# Patient Record
Sex: Male | Born: 1975 | Race: Black or African American | Hispanic: No | Marital: Married | Smoking: Never smoker
Health system: Southern US, Community
[De-identification: ages and names within clinical notes are randomized; demographics above are authoritative.]

## PROBLEM LIST (undated history)

## (undated) DIAGNOSIS — O2230 Deep phlebothrombosis in pregnancy, unspecified trimester: Secondary | ICD-10-CM

## (undated) DIAGNOSIS — Z86711 Personal history of pulmonary embolism: Secondary | ICD-10-CM

## (undated) DIAGNOSIS — D573 Sickle-cell trait: Secondary | ICD-10-CM

## (undated) DIAGNOSIS — R760 Raised antibody titer: Secondary | ICD-10-CM

## (undated) HISTORY — PX: NO PAST SURGERIES: SHX2092

---

## 2016-04-09 ENCOUNTER — Other Ambulatory Visit (HOSPITAL_COMMUNITY): Payer: Self-pay | Admitting: Sports Medicine

## 2016-04-09 ENCOUNTER — Other Ambulatory Visit (HOSPITAL_COMMUNITY): Payer: Self-pay | Admitting: Specialist

## 2016-04-09 ENCOUNTER — Ambulatory Visit (HOSPITAL_COMMUNITY)
Admission: RE | Admit: 2016-04-09 | Discharge: 2016-04-09 | Disposition: A | Discharge status: Home / Self Care | Payer: BLUE CROSS/BLUE SHIELD | Source: Ambulatory Visit | Attending: Cardiology | Admitting: Cardiology

## 2016-04-09 DIAGNOSIS — M79661 Pain in right lower leg: Secondary | ICD-10-CM

## 2016-04-09 DIAGNOSIS — M7989 Other specified soft tissue disorders: Secondary | ICD-10-CM | POA: Insufficient documentation

## 2016-04-10 ENCOUNTER — Other Ambulatory Visit: Payer: Self-pay | Admitting: Sports Medicine

## 2016-04-10 ENCOUNTER — Ambulatory Visit
Admission: RE | Admit: 2016-04-10 | Discharge: 2016-04-10 | Disposition: A | Discharge status: Home / Self Care | Payer: BLUE CROSS/BLUE SHIELD | Source: Ambulatory Visit | Attending: Sports Medicine | Admitting: Sports Medicine

## 2016-04-10 DIAGNOSIS — R0602 Shortness of breath: Secondary | ICD-10-CM

## 2016-04-10 MED ORDER — IOPAMIDOL (ISOVUE-370) INJECTION 76%
80.0000 mL | Freq: Once | INTRAVENOUS | Status: AC | PRN
  Administered 2016-04-10: 80 mL via INTRAVENOUS

## 2016-04-16 ENCOUNTER — Other Ambulatory Visit: Payer: Self-pay | Admitting: *Deleted

## 2016-04-16 ENCOUNTER — Telehealth: Payer: Self-pay | Admitting: Oncology

## 2016-04-16 DIAGNOSIS — I2699 Other pulmonary embolism without acute cor pulmonale: Secondary | ICD-10-CM

## 2016-04-16 DIAGNOSIS — I824Z9 Acute embolism and thrombosis of unspecified deep veins of unspecified distal lower extremity: Secondary | ICD-10-CM

## 2016-04-16 NOTE — Telephone Encounter (Signed)
Pt confirmed appt. No letter mailed out,

## 2016-04-17 ENCOUNTER — Telehealth: Payer: Self-pay | Admitting: Oncology

## 2016-04-17 ENCOUNTER — Telehealth: Payer: Self-pay | Admitting: *Deleted

## 2016-04-17 NOTE — Telephone Encounter (Signed)
Called patient to confirm new patient appt for Tuesday 04/24/2016.  Patient to be here at 745am

## 2016-04-17 NOTE — Telephone Encounter (Signed)
Patient called and wanted to cancel his appointment in Feburary. He was able to get in with Dr Myna HidalgoEnnever sooner

## 2016-04-19 ENCOUNTER — Telehealth (HOSPITAL_COMMUNITY): Payer: Self-pay | Admitting: Cardiology

## 2016-04-20 NOTE — Telephone Encounter (Signed)
Close encounter 

## 2016-04-23 DIAGNOSIS — I2699 Other pulmonary embolism without acute cor pulmonale: Secondary | ICD-10-CM | POA: Insufficient documentation

## 2016-04-23 DIAGNOSIS — I82401 Acute embolism and thrombosis of unspecified deep veins of right lower extremity: Secondary | ICD-10-CM | POA: Insufficient documentation

## 2016-04-24 ENCOUNTER — Other Ambulatory Visit (HOSPITAL_BASED_OUTPATIENT_CLINIC_OR_DEPARTMENT_OTHER): Payer: BLUE CROSS/BLUE SHIELD

## 2016-04-24 ENCOUNTER — Ambulatory Visit (HOSPITAL_BASED_OUTPATIENT_CLINIC_OR_DEPARTMENT_OTHER): Payer: BLUE CROSS/BLUE SHIELD | Admitting: Hematology & Oncology

## 2016-04-24 VITALS — BP 106/72 | HR 79 | Temp 97.7°F | Wt 209.2 lb

## 2016-04-24 DIAGNOSIS — I82491 Acute embolism and thrombosis of other specified deep vein of right lower extremity: Secondary | ICD-10-CM

## 2016-04-24 DIAGNOSIS — I2699 Other pulmonary embolism without acute cor pulmonale: Secondary | ICD-10-CM | POA: Diagnosis not present

## 2016-04-24 DIAGNOSIS — D573 Sickle-cell trait: Secondary | ICD-10-CM | POA: Diagnosis not present

## 2016-04-24 DIAGNOSIS — Z7901 Long term (current) use of anticoagulants: Secondary | ICD-10-CM

## 2016-04-24 DIAGNOSIS — I82401 Acute embolism and thrombosis of unspecified deep veins of right lower extremity: Secondary | ICD-10-CM

## 2016-04-24 LAB — CBC WITH DIFFERENTIAL (CANCER CENTER ONLY)
BASO#: 0 10*3/uL (ref 0.0–0.2)
BASO%: 0.3 % (ref 0.0–2.0)
EOS%: 3.9 % (ref 0.0–7.0)
Eosinophils Absolute: 0.1 10*3/uL (ref 0.0–0.5)
HEMATOCRIT: 44.6 % (ref 38.7–49.9)
HGB: 15.6 g/dL (ref 13.0–17.1)
LYMPH#: 1 10*3/uL (ref 0.9–3.3)
LYMPH%: 33.3 % (ref 14.0–48.0)
MCH: 30.5 pg (ref 28.0–33.4)
MCHC: 35 g/dL (ref 32.0–35.9)
MCV: 87 fL (ref 82–98)
MONO#: 0.3 10*3/uL (ref 0.1–0.9)
MONO%: 9.4 % (ref 0.0–13.0)
NEUT#: 1.6 10*3/uL (ref 1.5–6.5)
NEUT%: 53.1 % (ref 40.0–80.0)
PLATELETS: 225 10*3/uL (ref 145–400)
RBC: 5.12 10*6/uL (ref 4.20–5.70)
RDW: 13.1 % (ref 11.1–15.7)
WBC: 3.1 10*3/uL — ABNORMAL LOW (ref 4.0–10.0)

## 2016-04-24 NOTE — Progress Notes (Signed)
Referral MD  Reason for Referral: Bilateral pulmonary emboli and right peroneal vein thrombus   No chief complaint on file. : I had a blood clot in my right leg and lungs.  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 His family is from [place]. He is from [facility]. 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 He's been with them for 6 years. I apologized to him for not realizing that he had been with them for such a long time.  He actually has sickle cell trait. He's never had any problems with this. I told him to take some over-the-counter folic acid for this.  He has been very active. He works out. He plays basketball. He definitely is not sedentary.  He has been traveling quite a bit. He began to have some pain in the right leg several weeks ago. There was no swelling. He actually went to [facility] for a conference. The pain seemed to get a little bit worse.  At work, he noted the pain was progressing. He actually had a Doppler done at work. This showed something that was suspicious. A formal Doppler was done which showed a thrombus in the right peroneal vein.  He knows a cardiologist in the [place]. He recommended a CT angiogram. This was done on January 16. This showed bilateral pulmonary emboli. He did not have a high amount of thrombus. There was no right heart strain. Everything else looked okay.  He was placed on Xarelto. The pain in his right leg got better. He is wearing compression stockings.  He never had a cough. He never had any chest wall pain. He may have had a little bit of pressure under the sternum.  There is no history of blood clots in the family. His mother passed away at age 71 of ovarian cancer.  He does not smoke. He really does not drink alcohol. He has no diabetes. There is no obvious cholesterol issues.  There is no other history in the family of malignancy.  He is pretty much working  full-time. He really enjoys working for his orthopedic group.  Overall, his performance status is ECOG 0.   No past medical history on file.:  No past surgical history on file.:   Current Outpatient Prescriptions:  .  Rivaroxaban (XARELTO) 15 MG TABS tablet, Take 15 mg by mouth 2 (two) times daily with a meal., Disp: , Rfl: :  :  Allergies not on file:  No family history on file.:  Social History   Social History  . Marital status: Married    Spouse name: N/A  . Number of children: N/A  . Years of education: N/A   Occupational History  . Not on file.   Social History Main Topics  . Smoking status: Not on file  . Smokeless tobacco: Not on file  . Alcohol use Not on file  . Drug use: Unknown  . Sexual activity: Not on file   Other Topics Concern  . Not on file   Social History Narrative  . No narrative on file  :  Pertinent items are noted in HPI.  Exam: @IPVITALS @  Well-developed and well-nourished African-American male in no obvious distress. Vital signs show temperature of 97.7. Pulse 79. Blood pressure 106/72. Weight is 209 pounds. Head and neck exam shows no ocular or oral lesions. He has no palpable cervical or supraclavicular lymph nodes. There is no scleral icterus. Thyroid is  nonpalpable. Lungs are clear to percussion and auscultation bilaterally. Cardiac exam regular rate and rhythm with no murmurs, rubs or bruits. Abdomen is soft. He has good bowel sounds. There is no fluid wave. There is no palpable liver or spleen tip. Back exam shows no tenderness over the spine, ribs or hips. Extremities shows no clubbing, cyanosis or edema. He has no venous cord in his legs. He has a negative Homans sign in the right leg. He has good pulses in his distal extremities. He is wearing compression stockings on both legs. He has good range of motion of his joints. Skin exam shows no rashes, ecchymoses or petechia. Neurological exam shows no focal neurological deficits.     Recent Labs  04/24/16 0759  WBC 3.1*  HGB 15.6  HCT 44.6  PLT 225   No results for input(s): NA, K, CL, CO2, GLUCOSE, BUN, CREATININE, CALCIUM in the last 72 hours.  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  He has probably what will be an idiopathic thrombo-embolic event in his right leg and lungs.  I am not sure as to what might be an etiology. He has sickle cell trait. I suppose this could be considered a risk factor. He has never had a problem with sickle cell events. Again I did tell him to try some folic acid over-the-counter.  I did send off a hypercoagulable panel. I did send off a JAK2 assay.  I looked as blood under the microscope. Thyroid did not see anything that looked suspicious. He does not have erythrocytosis. He does not have thrombocytosis.  He has some leukopenia. 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 This is clinically not a problem.  I think that he will need anticoagulation for 1 year. I think that with pulmonary emboli, I tend to be a little bit more aggressive with treating these and treating them longer than lower extremity thrombus.  I will like to repeat his Dopplers of the leg and CT angiogram was see him back in 2 months. We continue everything the same day. This will make it easier for him with his busy work schedule.  He is wearing his compression stockings. I told make sure he keeps wearing these. I told him to make sure he stays well hydrated.  He wants go back to physical activity, I don't see any problem with this. I told him to he's into it.  He obviously is very well informed. I answered all his questions. I want to make sure that we continue to keep him active as he really helps out a lot of folks who have pain problems.  I spent about 45 minutes with Dr.Visconti..Marland Kitchen

## 2016-04-25 LAB — PROTEIN C ACTIVITY: Protein C-Functional: 150 % (ref 73–180)

## 2016-04-25 LAB — D-DIMER, QUANTITATIVE: D-DIMER: 0.43 mg/L FEU (ref 0.00–0.49)

## 2016-04-25 LAB — PROTEIN S ACTIVITY: Protein S-Functional: 131 % (ref 63–140)

## 2016-04-25 LAB — PROTEIN S, TOTAL: PROTEIN S AG TOTAL: 81 % (ref 60–150)

## 2016-04-25 LAB — ANTITHROMBIN III: ANTITHROMBIN ACTIVITY: 111 % (ref 75–135)

## 2016-04-26 LAB — BETA-2-GLYCOPROTEIN I ABS, IGG/M/A
Beta-2 Glyco 1 IgA: <9 GPI IgA units (ref 0–25)
Beta-2 Glyco 1 IgM: <9 GPI IgM units (ref 0–32)

## 2016-04-26 LAB — LUPUS ANTICOAGULANT PANEL
DRVVT CONFIRM: 2.1 ratio — AB (ref 0.8–1.2)
DRVVT: 128.7 s — AB (ref 0.0–47.0)
HEXAGONAL PHASE PHOSPHOLIPID: 16 s — AB (ref 0–11)
PTT-LA Incub Mix: 56.6 s — ABNORMAL HIGH (ref 0.0–48.9)
PTT-LA Mix: 46.4 s (ref 0.0–48.9)
PTT-LA: 53.2 s — ABNORMAL HIGH (ref 0.0–51.9)
dRVVT Mix: 63.4 s — ABNORMAL HIGH (ref 0.0–47.0)

## 2016-04-26 LAB — CARDIOLIPIN ANTIBODIES, IGG, IGM, IGA: Anticardiolipin Ab,IgG,Qn: <9 GPL U/mL (ref 0–14)

## 2016-04-26 LAB — PROTEIN C, TOTAL: Protein C Antigen: 121 % (ref 60–150)

## 2016-04-30 LAB — PROTHROMBIN GENE MUTATION

## 2016-04-30 LAB — FACTOR 5 LEIDEN

## 2016-04-30 NOTE — Addendum Note (Signed)
Addended by: Josph MachoENNEVER, PETER R on: 04/30/2016 06:21 PM   Modules accepted: Orders

## 2016-05-02 ENCOUNTER — Other Ambulatory Visit: Payer: Self-pay

## 2016-05-02 ENCOUNTER — Ambulatory Visit (HOSPITAL_COMMUNITY): Payer: BLUE CROSS/BLUE SHIELD | Attending: Internal Medicine

## 2016-05-02 DIAGNOSIS — I824Z9 Acute embolism and thrombosis of unspecified deep veins of unspecified distal lower extremity: Secondary | ICD-10-CM | POA: Diagnosis not present

## 2016-05-02 DIAGNOSIS — I2699 Other pulmonary embolism without acute cor pulmonale: Secondary | ICD-10-CM | POA: Diagnosis not present

## 2016-05-02 DIAGNOSIS — I071 Rheumatic tricuspid insufficiency: Secondary | ICD-10-CM | POA: Diagnosis not present

## 2016-05-08 ENCOUNTER — Ambulatory Visit: Payer: BLUE CROSS/BLUE SHIELD | Admitting: Oncology

## 2016-07-04 ENCOUNTER — Ambulatory Visit (HOSPITAL_BASED_OUTPATIENT_CLINIC_OR_DEPARTMENT_OTHER): Payer: BLUE CROSS/BLUE SHIELD | Admitting: Hematology & Oncology

## 2016-07-04 ENCOUNTER — Ambulatory Visit: Payer: BLUE CROSS/BLUE SHIELD

## 2016-07-04 ENCOUNTER — Ambulatory Visit (HOSPITAL_BASED_OUTPATIENT_CLINIC_OR_DEPARTMENT_OTHER)
Admission: RE | Admit: 2016-07-04 | Discharge: 2016-07-04 | Disposition: A | Discharge status: Home / Self Care | Payer: BLUE CROSS/BLUE SHIELD | Source: Ambulatory Visit | Attending: Hematology & Oncology | Admitting: Hematology & Oncology

## 2016-07-04 ENCOUNTER — Other Ambulatory Visit (HOSPITAL_BASED_OUTPATIENT_CLINIC_OR_DEPARTMENT_OTHER): Payer: BLUE CROSS/BLUE SHIELD

## 2016-07-04 ENCOUNTER — Encounter (HOSPITAL_BASED_OUTPATIENT_CLINIC_OR_DEPARTMENT_OTHER): Payer: Self-pay

## 2016-07-04 ENCOUNTER — Other Ambulatory Visit: Payer: Self-pay | Admitting: *Deleted

## 2016-07-04 VITALS — BP 106/56 | HR 49 | Temp 97.7°F | Resp 17 | Wt 212.0 lb

## 2016-07-04 DIAGNOSIS — D573 Sickle-cell trait: Secondary | ICD-10-CM | POA: Diagnosis not present

## 2016-07-04 DIAGNOSIS — I2699 Other pulmonary embolism without acute cor pulmonale: Secondary | ICD-10-CM | POA: Diagnosis not present

## 2016-07-04 DIAGNOSIS — I82401 Acute embolism and thrombosis of unspecified deep veins of right lower extremity: Secondary | ICD-10-CM

## 2016-07-04 DIAGNOSIS — R918 Other nonspecific abnormal finding of lung field: Secondary | ICD-10-CM | POA: Diagnosis not present

## 2016-07-04 DIAGNOSIS — Z7901 Long term (current) use of anticoagulants: Secondary | ICD-10-CM | POA: Diagnosis not present

## 2016-07-04 DIAGNOSIS — I82491 Acute embolism and thrombosis of other specified deep vein of right lower extremity: Secondary | ICD-10-CM | POA: Diagnosis not present

## 2016-07-04 DIAGNOSIS — Z86711 Personal history of pulmonary embolism: Secondary | ICD-10-CM | POA: Insufficient documentation

## 2016-07-04 DIAGNOSIS — Z86718 Personal history of other venous thrombosis and embolism: Secondary | ICD-10-CM | POA: Diagnosis not present

## 2016-07-04 LAB — CBC WITH DIFFERENTIAL (CANCER CENTER ONLY)
BASO#: 0 10*3/uL (ref 0.0–0.2)
BASO%: 0.3 % (ref 0.0–2.0)
EOS ABS: 0.1 10*3/uL (ref 0.0–0.5)
EOS%: 3.2 % (ref 0.0–7.0)
HEMATOCRIT: 42.8 % (ref 38.7–49.9)
HEMOGLOBIN: 15.1 g/dL (ref 13.0–17.1)
LYMPH#: 1.4 10*3/uL (ref 0.9–3.3)
LYMPH%: 36.9 % (ref 14.0–48.0)
MCH: 30.6 pg (ref 28.0–33.4)
MCHC: 35.3 g/dL (ref 32.0–35.9)
MCV: 87 fL (ref 82–98)
MONO#: 0.4 10*3/uL (ref 0.1–0.9)
MONO%: 11.6 % (ref 0.0–13.0)
NEUT%: 48 % (ref 40.0–80.0)
NEUTROS ABS: 1.8 10*3/uL (ref 1.5–6.5)
Platelets: 192 10*3/uL (ref 145–400)
RBC: 4.93 10*6/uL (ref 4.20–5.70)
RDW: 13 % (ref 11.1–15.7)
WBC: 3.8 10*3/uL — AB (ref 4.0–10.0)

## 2016-07-04 MED ORDER — IOPAMIDOL (ISOVUE-370) INJECTION 76%
100.0000 mL | Freq: Once | INTRAVENOUS | Status: AC | PRN
Start: 2016-07-04 — End: 2016-07-04
  Administered 2016-07-04: 100 mL via INTRAVENOUS

## 2016-07-04 MED ORDER — XARELTO 20 MG PO TABS: 20.0000 mg | ORAL_TABLET | Freq: Every day | ORAL | 6 refills | Status: DC

## 2016-07-04 NOTE — Progress Notes (Signed)
Hematology and Oncology Follow Up Visit  Eric Gaines [id number] 12-28-1975 41 y.o. 07/04/2016   Principle Diagnosis:   Bilateral pulmonary emboli/right peroneal vein thrombosis  Transiently positive lupus anticoagulant  Sickle cell trait  Current Therapy:    Xarelto 20 mg by mouth daily - complete 6 months of full dose therapy in July 2018  Folic acid 800 g by mouth daily     Interim History:  Eric Gaines is back for follow-up. This is his second office visit. I first saw him in January. At that time, repeat has a him for hypercoagulable states. Of course, he was positive for a lupus anticoagulant. I'm not sure why this laboratory that we use have over a 70% positive lupus anticoagulant result.  He is on Xarelto. He is doing well with Xarelto.  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 He has had no problems with this.  We did go ahead and repeat his CT angiogram today. There is no evidence of residual pulmonary emboli. I'm not sure exactly what the radiologist meant by a "single nonobstructing web" within left lower lobe segmental branch. There is no thromboembolic disease in his legs.  He is exercising. He is traveling. He does wear compression stockings on occasion.  He has had no bleeding. He has had no nausea or vomiting. There's been no change in bowel or bladder habits.  He has not noted any leg swelling.  Overall, his performance status is ECOG 0.  Medications:  Current Outpatient Prescriptions:  .  XARELTO 20 MG TABS tablet, Take 1 tablet (20 mg total) by mouth daily., Disp: 30 tablet, Rfl: 6  Allergies: Not on File  Past Medical History, Surgical history, Social history, and Family History were reviewed and updated.  Review of Systems: As above  Physical Exam:  weight is 212 lb (96.2 kg). His oral temperature is 97.7 F (36.5 C). His blood pressure is 106/56 (abnormal) and his pulse is 49 (abnormal). His respiration is 17  and oxygen saturation is 99%.   Wt Readings from Last 3 Encounters:  07/04/16 212 lb (96.2 kg)  04/24/16 209 lb 4 oz (94.9 kg)      Head and neck exam shows no ocular or oral lesions. He has no palpable cervical or supraclavicular lymph nodes. There is no scleral icterus. Thyroid is nonpalpable. Lungs are clear to percussion and auscultation bilaterally. Cardiac exam regular rate and rhythm with no murmurs, rubs or bruits. Abdomen is soft. He has good bowel sounds. There is no fluid wave. There is no palpable liver or spleen tip. Back exam shows no tenderness over the spine, ribs or hips. Extremities shows no clubbing, cyanosis or edema. He has no venous cord in his legs. He has a negative Homans sign in the right leg. He has good pulses in his distal extremities. He is wearing compression stockings on both legs. He has good range of motion of his joints. Skin exam shows no rashes, ecchymoses or petechia. Neurological exam shows no focal neurological deficits.  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 He is doing well. His thromboembolic disease has resolved which is a blessing.  I will keep him on full dose Xarelto for 6 months. He'll finish up the 6 months in July. After that, I will put him on one year of low-dose maintenance Xarelto.  I'm not sure exactly what the lupus anticoagulant positivity means. Again, most of our patients seems to have this according to the lab that we send our blood work to.  I will repeat the lupus anticoagulant panel. If this is positive, then he will need to be on aspirin.  I will like to see him back in 3 months. At  that point, we will then get him onto low-dose maintenance Xarelto.  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 He agrees with my recommendations.   Josph Macho, MD 4/11/20185:36 PM

## 2016-07-05 LAB — HEMOGLOBINOPATHY EVALUATION
HGB A: 57.1 % — AB (ref 96.4–98.8)
HGB C: 0 %
HGB S: 39.7 % — ABNORMAL HIGH
HGB VARIANT: 0 %
Hemoglobin A2 Quantitation: 3.2 % (ref 1.8–3.2)
Hemoglobin F Quantitation: 0 % (ref 0.0–2.0)

## 2016-07-05 LAB — D-DIMER, QUANTITATIVE (NOT AT ARMC): D-DIMER: 0.2 mg{FEU}/L (ref 0.00–0.49)

## 2016-07-06 LAB — LUPUS ANTICOAGULANT PANEL
DRVVT CONFIRM: 2 ratio — AB (ref 0.8–1.2)
PTT-LA: 50.5 s (ref 0.0–51.9)
dRVVT Mix: 77 s — ABNORMAL HIGH (ref 0.0–47.0)
dRVVT: 123.8 s — ABNORMAL HIGH (ref 0.0–47.0)

## 2016-07-18 ENCOUNTER — Other Ambulatory Visit: Payer: BLUE CROSS/BLUE SHIELD

## 2016-07-18 ENCOUNTER — Other Ambulatory Visit (HOSPITAL_BASED_OUTPATIENT_CLINIC_OR_DEPARTMENT_OTHER): Payer: BLUE CROSS/BLUE SHIELD

## 2016-07-18 ENCOUNTER — Ambulatory Visit: Payer: BLUE CROSS/BLUE SHIELD | Admitting: Hematology & Oncology

## 2016-07-19 ENCOUNTER — Other Ambulatory Visit (HOSPITAL_BASED_OUTPATIENT_CLINIC_OR_DEPARTMENT_OTHER): Payer: BLUE CROSS/BLUE SHIELD

## 2016-07-19 ENCOUNTER — Other Ambulatory Visit: Payer: BLUE CROSS/BLUE SHIELD

## 2016-07-19 ENCOUNTER — Ambulatory Visit: Payer: BLUE CROSS/BLUE SHIELD | Admitting: Hematology & Oncology

## 2016-10-03 ENCOUNTER — Other Ambulatory Visit: Payer: BLUE CROSS/BLUE SHIELD

## 2016-10-03 ENCOUNTER — Ambulatory Visit: Payer: BLUE CROSS/BLUE SHIELD | Admitting: Hematology & Oncology

## 2016-10-17 ENCOUNTER — Other Ambulatory Visit (HOSPITAL_BASED_OUTPATIENT_CLINIC_OR_DEPARTMENT_OTHER): Payer: BLUE CROSS/BLUE SHIELD

## 2016-10-17 ENCOUNTER — Ambulatory Visit (HOSPITAL_BASED_OUTPATIENT_CLINIC_OR_DEPARTMENT_OTHER): Payer: BLUE CROSS/BLUE SHIELD | Admitting: Hematology & Oncology

## 2016-10-17 VITALS — BP 116/63 | HR 50 | Temp 98.2°F | Resp 17 | Wt 215.0 lb

## 2016-10-17 DIAGNOSIS — I2699 Other pulmonary embolism without acute cor pulmonale: Secondary | ICD-10-CM

## 2016-10-17 DIAGNOSIS — D573 Sickle-cell trait: Secondary | ICD-10-CM | POA: Diagnosis not present

## 2016-10-17 DIAGNOSIS — Z7901 Long term (current) use of anticoagulants: Secondary | ICD-10-CM

## 2016-10-17 DIAGNOSIS — I82491 Acute embolism and thrombosis of other specified deep vein of right lower extremity: Secondary | ICD-10-CM

## 2016-10-17 LAB — CBC WITH DIFFERENTIAL (CANCER CENTER ONLY)
BASO#: 0 10*3/uL (ref 0.0–0.2)
BASO%: 0.3 % (ref 0.0–2.0)
EOS%: 4.3 % (ref 0.0–7.0)
Eosinophils Absolute: 0.2 10*3/uL (ref 0.0–0.5)
HEMATOCRIT: 44.1 % (ref 38.7–49.9)
HEMOGLOBIN: 15.4 g/dL (ref 13.0–17.1)
LYMPH#: 1.3 10*3/uL (ref 0.9–3.3)
LYMPH%: 37.9 % (ref 14.0–48.0)
MCH: 30.7 pg (ref 28.0–33.4)
MCHC: 34.9 g/dL (ref 32.0–35.9)
MCV: 88 fL (ref 82–98)
MONO#: 0.4 10*3/uL (ref 0.1–0.9)
MONO%: 10.5 % (ref 0.0–13.0)
NEUT%: 47 % (ref 40.0–80.0)
NEUTROS ABS: 1.7 10*3/uL (ref 1.5–6.5)
Platelets: 201 10*3/uL (ref 145–400)
RBC: 5.02 10*6/uL (ref 4.20–5.70)
RDW: 13.4 % (ref 11.1–15.7)
WBC: 3.5 10*3/uL — AB (ref 4.0–10.0)

## 2016-10-17 LAB — CMP (CANCER CENTER ONLY)
ALBUMIN: 3.9 g/dL (ref 3.3–5.5)
ALK PHOS: 67 U/L (ref 26–84)
ALT(SGPT): 31 U/L (ref 10–47)
AST: 26 U/L (ref 11–38)
BILIRUBIN TOTAL: 1 mg/dL (ref 0.20–1.60)
BUN, Bld: 12 mg/dL (ref 7–22)
CALCIUM: 9.1 mg/dL (ref 8.0–10.3)
CO2: 29 mEq/L (ref 18–33)
CREATININE: 1 mg/dL (ref 0.6–1.2)
Chloride: 104 mEq/L (ref 98–108)
Glucose, Bld: 84 mg/dL (ref 73–118)
Potassium: 3.8 mEq/L (ref 3.3–4.7)
SODIUM: 139 meq/L (ref 128–145)
Total Protein: 7.2 g/dL (ref 6.4–8.1)

## 2016-10-17 MED ORDER — RIVAROXABAN 10 MG PO TABS: 10.0000 mg | ORAL_TABLET | Freq: Every day | ORAL | 12 refills | Status: DC

## 2016-10-17 NOTE — Progress Notes (Signed)
Hematology and Oncology Follow Up Visit  Eric Gaines [id number] 1975/08/02 41 y.o. 10/17/2016   Principle Diagnosis:   Bilateral pulmonary emboli/right peroneal vein thrombosis  Transiently positive lupus anticoagulant  Sickle cell trait  Current Therapy:    Xarelto 20 mg by mouth daily - complete 6 months of full dose therapy in July 2018  Xarelto 10 mg by mouth daily-maintenance therapy for one year to be completed in July 2019  Folic acid 800 g by mouth daily     Interim History:  Mr. Eric Gaines is back for follow-up. He is doing quite well. 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 He does a lot of spinal work.  He is doing well with Xarelto. He will finish one year this month and then go to maintenance therapy with 10 mg.  Of note, he did have a positive lupus anticoagulant study we first saw him. I am repeating this today. If he is positive for the lupus anticoagulant, we will see about aspirin added to the Xarelto.  He's had no leg pain. There's been no swelling. He's had no cough or shortness of breath. He's had no chest wall pain.  He is working out.  He does have sickle cell trait. He is on folic acid for this.  He's had no fever. He's had no rashes. He's had no change in bowel or bladder habits.  Overall, his performance status is ECOG 0.  Medications:  Current Outpatient Prescriptions:  .  XARELTO 20 MG TABS tablet, Take 1 tablet (20 mg total) by mouth daily., Disp: 30 tablet, Rfl: 6  Allergies: Not on File  Past Medical History, Surgical history, Social history, and Family History were reviewed and updated.  Review of Systems: As above  Physical Exam:  vitals were not taken for this visit.  Wt Readings from Last 3 Encounters:  07/04/16 212 lb (96.2 kg)  04/24/16 209 lb 4 oz (94.9 kg)      Head and neck exam shows no ocular or oral lesions. He has no palpable cervical or supraclavicular lymph nodes. There  is no scleral icterus. Thyroid is nonpalpable. Lungs are clear to percussion and auscultation bilaterally. Cardiac exam regular rate and rhythm with no murmurs, rubs or bruits. Abdomen is soft. He has good bowel sounds. There is no fluid wave. There is no palpable liver or spleen tip. Back exam shows no tenderness over the spine, ribs or hips. Extremities shows no clubbing, cyanosis or edema. He has no venous cord in his legs. He has a negative Homans sign in the right leg. He has good pulses in his distal extremities. He is wearing compression stockings on both legs. He has good range of motion of his joints. Skin exam shows no rashes, ecchymoses or petechia. Neurological exam shows no focal neurological deficits.  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 He is doing well. His thromboembolic disease has resolved which is a blessing.  I will now put him on maintenance Xarelto. I will keep him on maintenance Xarelto for  one year.  We will see what his lupus anticoagulant study is. If this is positive, I will add 81 mg of baby aspirin.   I do not see a problem with him going down to [place]. He is going for a medical mission. His wife actually has family in [place], so they will be spending an extra week.  I will like to see him back in 4 months. I'll see that we have to do any scans.   Josph MachoENNEVER,Mattie Novosel R, MD 7/25/20183:42 PM

## 2016-10-18 LAB — D-DIMER, QUANTITATIVE: D-DIMER: <0.2 mg/L FEU (ref 0.00–0.49)

## 2016-10-20 LAB — LUPUS ANTICOAGULANT PANEL
DRVVT CONFIRM: 1.4 ratio — AB (ref 0.8–1.2)
PTT-LA: 45.6 s (ref 0.0–51.9)
dRVVT Mix: 51.4 s — ABNORMAL HIGH (ref 0.0–47.0)
dRVVT: 71.9 s — ABNORMAL HIGH (ref 0.0–47.0)

## 2016-10-23 ENCOUNTER — Telehealth: Payer: Self-pay

## 2016-10-23 NOTE — Telephone Encounter (Signed)
-----   Message from Josph MachoPeter R Ennever, MD sent at 10/22/2016  4:35 PM EDT ----- I left a message on his cell phone. I told him that the lupus anticoagulant is still positive. Because of that, we have to get him on aspirin. I told him to take a baby aspirin with 81 mg daily. I told him to make sure it's coated aspirin and that he takes it with food. He is to take this in addition to the Xarelto. If he has any questions he can always give us a call.  Cindee LamePete

## 2017-02-27 ENCOUNTER — Other Ambulatory Visit: Payer: BLUE CROSS/BLUE SHIELD

## 2017-02-27 ENCOUNTER — Ambulatory Visit: Payer: BLUE CROSS/BLUE SHIELD | Admitting: Hematology & Oncology

## 2017-03-28 ENCOUNTER — Other Ambulatory Visit: Payer: BLUE CROSS/BLUE SHIELD

## 2017-03-28 ENCOUNTER — Ambulatory Visit (HOSPITAL_BASED_OUTPATIENT_CLINIC_OR_DEPARTMENT_OTHER): Payer: BLUE CROSS/BLUE SHIELD | Admitting: Hematology & Oncology

## 2017-03-28 ENCOUNTER — Other Ambulatory Visit: Payer: Self-pay

## 2017-03-28 VITALS — BP 107/59 | HR 57 | Temp 97.7°F | Resp 20 | Wt 213.5 lb

## 2017-03-28 DIAGNOSIS — I82419 Acute embolism and thrombosis of unspecified femoral vein: Secondary | ICD-10-CM | POA: Diagnosis not present

## 2017-03-28 DIAGNOSIS — I2699 Other pulmonary embolism without acute cor pulmonale: Secondary | ICD-10-CM

## 2017-03-28 DIAGNOSIS — D573 Sickle-cell trait: Secondary | ICD-10-CM

## 2017-03-28 DIAGNOSIS — Z7901 Long term (current) use of anticoagulants: Secondary | ICD-10-CM | POA: Diagnosis not present

## 2017-03-28 LAB — CMP (CANCER CENTER ONLY)
ALBUMIN: 3.9 g/dL (ref 3.3–5.5)
ALK PHOS: 74 U/L (ref 26–84)
ALT: 39 U/L (ref 10–47)
AST: 26 U/L (ref 11–38)
BILIRUBIN TOTAL: 0.9 mg/dL (ref 0.20–1.60)
BUN: 13 mg/dL (ref 7–22)
CHLORIDE: 103 meq/L (ref 98–108)
CO2: 31 mEq/L (ref 18–33)
CREATININE: 1.1 mg/dL (ref 0.6–1.2)
Calcium: 9.4 mg/dL (ref 8.0–10.3)
Glucose, Bld: 97 mg/dL (ref 73–118)
Potassium: 4.2 mEq/L (ref 3.3–4.7)
SODIUM: 146 meq/L — AB (ref 128–145)
TOTAL PROTEIN: 7.2 g/dL (ref 6.4–8.1)

## 2017-03-28 LAB — CBC WITH DIFFERENTIAL (CANCER CENTER ONLY)
BASO#: 0 10*3/uL (ref 0.0–0.2)
BASO%: 0.3 % (ref 0.0–2.0)
EOS%: 3.3 % (ref 0.0–7.0)
Eosinophils Absolute: 0.1 10*3/uL (ref 0.0–0.5)
HCT: 45.2 % (ref 38.7–49.9)
HEMOGLOBIN: 16 g/dL (ref 13.0–17.1)
LYMPH#: 1.1 10*3/uL (ref 0.9–3.3)
LYMPH%: 34.3 % (ref 14.0–48.0)
MCH: 30.6 pg (ref 28.0–33.4)
MCHC: 35.4 g/dL (ref 32.0–35.9)
MCV: 86 fL (ref 82–98)
MONO#: 0.3 10*3/uL (ref 0.1–0.9)
MONO%: 8.5 % (ref 0.0–13.0)
NEUT%: 53.6 % (ref 40.0–80.0)
NEUTROS ABS: 1.8 10*3/uL (ref 1.5–6.5)
PLATELETS: 187 10*3/uL (ref 145–400)
RBC: 5.23 10*6/uL (ref 4.20–5.70)
RDW: 13.4 % (ref 11.1–15.7)
WBC: 3.3 10*3/uL — AB (ref 4.0–10.0)

## 2017-03-28 NOTE — Progress Notes (Signed)
Hematology and Oncology Follow Up Visit  Eric Gaines [id number] 1975-06-30 42 y.o. 03/28/2017   Principle Diagnosis:   Bilateral pulmonary emboli/right peroneal vein thrombosis  Positive lupus anticoagulant  Sickle cell trait  Current Therapy:    Xarelto 20 mg by mouth daily - complete 6 months of full dose therapy in July 2018  Xarelto 10 mg by mouth daily-maintenance therapy for one year to be completed in July 2019  Folic acid 800 g by mouth daily  EC ASA 81 mg po q day     Interim History:  Eric Gaines is back for follow-up.  He is doing quite well.  He had a very busy summer.  He and his wife went on a mission trip to [place].  He really was busy on the mission trip.  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  He has had no problems with the Xarelto.  He is on 10 mg a day.  He is working out.  He is playing basketball.  He is exercising.  He is having no issues with bleeding.  There is no cough or shortness of breath.  He has no chest wall pain.  He has had no leg swelling.  There is no change in bowel or bladder habits.  When we last checked his lupus anticoagulant level in July, he was still positive for the lupus anticoagulant.  I told him to take a baby aspirin daily.  Overall, his performance status is ECOG 0.  Medications:  Current Outpatient Medications:  .  aspirin 81 MG chewable tablet, Chew by mouth daily., Disp: , Rfl:  .  folic acid (FOLVITE) 1 MG tablet, Take 1 mg by mouth daily., Disp: , Rfl:  .  rivaroxaban (XARELTO) 10 MG TABS tablet, Take 1 tablet (10 mg total) by mouth daily., Disp: 30 tablet, Rfl: 12  Allergies: No Known Allergies  Past Medical History, Surgical history, Social history, and Family History were reviewed and updated.  Review of Systems: Review of Systems  All other systems reviewed and are negative.    Physical Exam:  weight is 213 lb 8 oz (96.8  kg). His oral temperature is 97.7 F (36.5 C). His blood pressure is 107/59 (abnormal) and his pulse is 57 (abnormal). His respiration is 20 and oxygen saturation is 100%.   Wt Readings from Last 3 Encounters:  03/28/17 213 lb 8 oz (96.8 kg)  10/17/16 215 lb (97.5 kg)  07/04/16 212 lb (96.2 kg)    Physical Exam  Constitutional: He is oriented to person, place, and time.  HENT:  Head: Normocephalic and atraumatic.  Mouth/Throat: Oropharynx is clear and moist.  Eyes: EOM are normal. Pupils are equal, round, and reactive to light.  Neck: Normal range of motion.  Cardiovascular: Normal rate, regular rhythm and normal heart sounds.  Pulmonary/Chest: Effort normal and breath sounds normal.  Abdominal: Soft. Bowel sounds are normal.  Musculoskeletal: Normal range of motion. He exhibits no edema, tenderness or deformity.  Lymphadenopathy:    He has no cervical adenopathy.  Neurological: He is alert and oriented to person, place, and time.  Skin: Skin is warm and dry. No rash noted. No erythema.  Psychiatric: He has a normal mood and affect. His behavior is normal. Judgment and thought content normal.  Vitals reviewed.   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 He is doing well. His thromboembolic disease has resolved which is a blessing.  We will keep him on the maintenance Xarelto for right now.   He is also on the baby aspirin.  He does have a lupus anticoagulant.  He has been tested for this twice.  As such, I think that we will have to have him on some form of blood thinner for the foreseeable future.  I will plan to see him back in 6 months.  At that time, he will have had one year of the maintenance Xarelto.  I may consider a full dose aspirin at that time given the lupus anticoagulant.  He would be okay with this.  Josph MachoPeter R Brogen Duell, MD 1/3/20198:37 AM

## 2017-03-30 LAB — LUPUS ANTICOAGULANT PANEL
DRVVT CONFIRM: 2 ratio — AB (ref 0.8–1.2)
DRVVT MIX: 58.5 s — AB (ref 0.0–47.0)
PTT-LA: 48.7 s (ref 0.0–51.9)
dRVVT: 91.6 s — ABNORMAL HIGH (ref 0.0–47.0)

## 2017-09-25 ENCOUNTER — Inpatient Hospital Stay: Payer: BLUE CROSS/BLUE SHIELD | Admitting: Family

## 2017-09-25 ENCOUNTER — Inpatient Hospital Stay: Payer: BLUE CROSS/BLUE SHIELD

## 2017-10-10 ENCOUNTER — Inpatient Hospital Stay (HOSPITAL_BASED_OUTPATIENT_CLINIC_OR_DEPARTMENT_OTHER): Payer: BLUE CROSS/BLUE SHIELD | Admitting: Hematology & Oncology

## 2017-10-10 ENCOUNTER — Telehealth: Payer: Self-pay | Admitting: *Deleted

## 2017-10-10 ENCOUNTER — Inpatient Hospital Stay: Payer: BLUE CROSS/BLUE SHIELD | Attending: Hematology & Oncology

## 2017-10-10 ENCOUNTER — Other Ambulatory Visit: Payer: Self-pay

## 2017-10-10 VITALS — BP 113/61 | HR 60 | Temp 97.8°F | Resp 20 | Wt 217.2 lb

## 2017-10-10 DIAGNOSIS — I2699 Other pulmonary embolism without acute cor pulmonale: Secondary | ICD-10-CM | POA: Diagnosis not present

## 2017-10-10 DIAGNOSIS — I82491 Acute embolism and thrombosis of other specified deep vein of right lower extremity: Secondary | ICD-10-CM | POA: Diagnosis not present

## 2017-10-10 DIAGNOSIS — Z7982 Long term (current) use of aspirin: Secondary | ICD-10-CM

## 2017-10-10 DIAGNOSIS — D573 Sickle-cell trait: Secondary | ICD-10-CM | POA: Insufficient documentation

## 2017-10-10 LAB — CMP (CANCER CENTER ONLY)
ALT: 25 U/L (ref 0–44)
AST: 22 U/L (ref 15–41)
Albumin: 4.3 g/dL (ref 3.5–5.0)
Alkaline Phosphatase: 70 U/L (ref 38–126)
Anion gap: 7 (ref 5–15)
BUN: 16 mg/dL (ref 6–20)
CHLORIDE: 106 mmol/L (ref 98–111)
CO2: 27 mmol/L (ref 22–32)
CREATININE: 1.22 mg/dL (ref 0.61–1.24)
Calcium: 9.4 mg/dL (ref 8.9–10.3)
GFR, Estimated: >60 mL/min (ref >60)
Glucose, Bld: 91 mg/dL (ref 70–99)
Potassium: 4.2 mmol/L (ref 3.5–5.1)
SODIUM: 140 mmol/L (ref 135–145)
Total Bilirubin: 0.8 mg/dL (ref 0.3–1.2)
Total Protein: 7.1 g/dL (ref 6.5–8.1)

## 2017-10-10 LAB — CBC WITH DIFFERENTIAL (CANCER CENTER ONLY)
BASOS ABS: 0 10*3/uL (ref 0.0–0.1)
Basophils Relative: 0 %
EOS ABS: 0.2 10*3/uL (ref 0.0–0.5)
Eosinophils Relative: 5 %
HCT: 42.7 % (ref 38.7–49.9)
Hemoglobin: 14.9 g/dL (ref 13.0–17.1)
Lymphocytes Relative: 36 %
Lymphs Abs: 1.2 10*3/uL (ref 0.9–3.3)
MCH: 30.5 pg (ref 28.0–33.4)
MCHC: 34.9 g/dL (ref 32.0–35.9)
MCV: 87.5 fL (ref 82.0–98.0)
Monocytes Absolute: 0.4 10*3/uL (ref 0.1–0.9)
Monocytes Relative: 13 %
Neutro Abs: 1.5 10*3/uL (ref 1.5–6.5)
Neutrophils Relative %: 46 %
Platelet Count: 195 10*3/uL (ref 145–400)
RBC: 4.88 MIL/uL (ref 4.20–5.70)
RDW: 13.1 % (ref 11.1–15.7)
WBC Count: 3.2 10*3/uL — ABNORMAL LOW (ref 4.0–10.0)

## 2017-10-10 NOTE — Progress Notes (Signed)
Hematology and Oncology Follow Up Visit  Eric BellingWesley Gaines [id number] [birth year] 42 y.o. 10/10/2017   Principle Diagnosis:   Bilateral pulmonary emboli/right peroneal vein thrombosis  Positive lupus anticoagulant  Sickle cell trait  Current Therapy:    Xarelto 20 mg by mouth daily - complete 6 months of full dose therapy in July 2018  Xarelto 10 mg by mouth daily-maintenance therapy for one year - completed in July 2019  Folic acid 800 g by mouth daily  EC ASA 162 mg po q day -- start on 10/10/2017     Interim History:  Eric Gaines is back for follow-up.  He is doing well.  He has now been on maintenance Xarelto for 1 year.  We will stop the maintenance Xarelto and have him on aspirin at 162 mg a day.  He does have the lupus anticoagulant.  We checked this every time that we can see him.    When we last saw him in January, he had a lupus anticoagulant.  He and his wife went to [place] for vacation.  They had a good time.  He had no problems with respect to cough.  There is no chest wall pain.  He had no leg swelling.  Eric Gaines does wear his compression stocking as needed.  He wears this when he works out.  He wears it when he travels.  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  He does the injections.  He has had no fever.  He is had no rashes.  He has had no dyspepsia.   Overall, his performance status is ECOG 0.  Medications:  Current Outpatient Medications:  .  aspirin 81 MG chewable tablet, Chew by mouth daily., Disp: , Rfl:  .  folic acid (FOLVITE) 1 MG tablet, Take 1 mg by mouth daily., Disp: , Rfl:  .  rivaroxaban (XARELTO) 10 MG TABS tablet, Take 1 tablet (10 mg total) by mouth daily., Disp: 30 tablet, Rfl: 12  Allergies: No Known Allergies  Past Medical History, Surgical history, Social history, and Family History were reviewed and updated.  Review of Systems: Review of Systems  All other systems reviewed and are  negative.    Physical Exam:  vitals were not taken for this visit.   Wt Readings from Last 3 Encounters:  03/28/17 213 lb 8 oz (96.8 kg)  10/17/16 215 lb (97.5 kg)  07/04/16 212 lb (96.2 kg)    Physical Exam  Constitutional: He is oriented to person, place, and time.  HENT:  Head: Normocephalic and atraumatic.  Mouth/Throat: Oropharynx is clear and moist.  Eyes: Pupils are equal, round, and reactive to light. EOM are normal.  Neck: Normal range of motion.  Cardiovascular: Normal rate, regular rhythm and normal heart sounds.  Pulmonary/Chest: Effort normal and breath sounds normal.  Abdominal: Soft. Bowel sounds are normal.  Musculoskeletal: Normal range of motion. He exhibits no edema, tenderness or deformity.  Lymphadenopathy:    He has no cervical adenopathy.  Neurological: He is alert and oriented to person, place, and time.  Skin: Skin is warm and dry. No rash noted. No erythema.  Psychiatric: He has a normal mood and affect. His behavior is normal. Judgment and thought content normal.  Vitals reviewed.   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 He is doing well. His thromboembolic disease has resolved which is a blessing.  Again, we will have him on baby aspirin.  He will take the 162 mg dose.  I will see him back in 6 months.  We will check his lupus anticoagulant level.   If all looks good, then we probably will let him go from our clinic.   Josph Macho, MD 7/18/20198:07 AM

## 2017-10-10 NOTE — Telephone Encounter (Signed)
Message left on patient's private cell to notify him per order of Dr. Myna HidalgoEnnever that chemistry studies are normal.  Instructed pt to call office back with any questions or concerns.

## 2017-10-13 LAB — DRVVT MIX: DRVVT MIX: 54 s — AB (ref 0.0–47.0)

## 2017-10-13 LAB — LUPUS ANTICOAGULANT PANEL
DRVVT: 74 s — ABNORMAL HIGH (ref 0.0–47.0)
PTT Lupus Anticoagulant: 50.1 s (ref 0.0–51.9)

## 2017-10-13 LAB — DRVVT CONFIRM: dRVVT Confirm: 1.4 ratio — ABNORMAL HIGH (ref 0.8–1.2)

## 2018-03-07 ENCOUNTER — Other Ambulatory Visit: Payer: Self-pay | Admitting: Hematology & Oncology

## 2018-03-07 DIAGNOSIS — I2699 Other pulmonary embolism without acute cor pulmonale: Secondary | ICD-10-CM

## 2018-03-07 MED ORDER — RIVAROXABAN 10 MG PO TABS: 10.0000 mg | ORAL_TABLET | Freq: Every day | ORAL | 3 refills | Status: AC

## 2018-04-10 ENCOUNTER — Other Ambulatory Visit: Payer: BLUE CROSS/BLUE SHIELD

## 2018-04-10 ENCOUNTER — Ambulatory Visit: Payer: BLUE CROSS/BLUE SHIELD | Admitting: Hematology & Oncology

## 2018-04-24 ENCOUNTER — Other Ambulatory Visit: Payer: Self-pay

## 2018-04-24 ENCOUNTER — Inpatient Hospital Stay
Payer: No Typology Code available for payment source | Attending: Hematology & Oncology | Admitting: Hematology & Oncology

## 2018-04-24 VITALS — BP 107/53 | HR 55 | Temp 97.7°F | Resp 20 | Wt 222.0 lb

## 2018-04-24 DIAGNOSIS — I2699 Other pulmonary embolism without acute cor pulmonale: Secondary | ICD-10-CM | POA: Diagnosis not present

## 2018-04-24 DIAGNOSIS — D573 Sickle-cell trait: Secondary | ICD-10-CM | POA: Diagnosis not present

## 2018-04-24 DIAGNOSIS — Z7982 Long term (current) use of aspirin: Secondary | ICD-10-CM | POA: Insufficient documentation

## 2018-04-24 DIAGNOSIS — Z7901 Long term (current) use of anticoagulants: Secondary | ICD-10-CM | POA: Insufficient documentation

## 2018-04-24 DIAGNOSIS — I82451 Acute embolism and thrombosis of right peroneal vein: Secondary | ICD-10-CM | POA: Diagnosis not present

## 2018-04-24 DIAGNOSIS — D6862 Lupus anticoagulant syndrome: Secondary | ICD-10-CM | POA: Diagnosis not present

## 2018-04-24 NOTE — Progress Notes (Signed)
Hematology and Oncology Follow Up Visit  Eric Gaines [id number] Mar 14, 1976 43 y.o. 04/24/2018   Principle Diagnosis:   Bilateral pulmonary emboli/right peroneal vein thrombosis  Positive lupus anticoagulant -- (-) on zero 116/2020  Sickle cell trait  RIGHT plantar vein thrombosis  Current Therapy:    Xarelto 20 mg by mouth daily - complete 6 months of full dose therapy in July 2018  Xarelto 10 mg by mouth daily-maintenance therapy for one year - completed in July 2019  Folic acid 800 g by mouth daily  EC ASA 325 mg po q day -- start on 10/10/2017     Interim History:  Eric Gaines is back for follow-up.  Surprisingly, he developed a thrombus in his right plantar vein.  I must say that I have never heard of this in the 25 years I have been working.  It does happen.  He is on full dose aspirin right now.  He had lab work done at a local lab because of the cost.  Thankfully, the local lab did not show any obvious lupus anticoagulant.  He says that he takes Xarelto when he goes on trips.  I think this is a good idea for him.  There is been no problems with bleeding.  He has had no dyspepsia.  He has had no cough or shortness of breath.  He says that there is no pain in his right foot or leg.  Overall, his performance status is ECOG 0.     Medications:  Current Outpatient Medications:  .  aspirin 325 MG EC tablet, Take 325 mg by mouth daily., Disp: , Rfl:  .  folic acid (FOLVITE) 1 MG tablet, Take 1 mg by mouth daily., Disp: , Rfl:  .  rivaroxaban (XARELTO) 10 MG TABS tablet, Take 1 tablet (10 mg total) by mouth daily. (Patient taking differently: Take 10 mg by mouth daily. Taking PRN), Disp: 90 tablet, Rfl: 3 .  oxyCODONE-acetaminophen (PERCOCET) 10-325 MG tablet, TK 1 T PO Q 8 H PRN P, Disp: , Rfl: 0  Allergies: No Known Allergies  Past Medical History, Surgical history, Social history, and Family History were reviewed and updated.  Review of Systems: Review of  Systems  All other systems reviewed and are negative.    Physical Exam:  weight is 222 lb (100.7 kg). His oral temperature is 97.7 F (36.5 C). His blood pressure is 107/53 (abnormal) and his pulse is 55 (abnormal). His respiration is 20 and oxygen saturation is 100%.   Wt Readings from Last 3 Encounters:  04/24/18 222 lb (100.7 kg)  10/10/17 217 lb 4 oz (98.5 kg)  03/28/17 213 lb 8 oz (96.8 kg)    Physical Exam Vitals signs reviewed.  HENT:     Head: Normocephalic and atraumatic.  Eyes:     Pupils: Pupils are equal, round, and reactive to light.  Neck:     Musculoskeletal: Normal range of motion.  Cardiovascular:     Rate and Rhythm: Normal rate and regular rhythm.     Heart sounds: Normal heart sounds.  Pulmonary:     Effort: Pulmonary effort is normal.     Breath sounds: Normal breath sounds.  Abdominal:     General: Bowel sounds are normal.     Palpations: Abdomen is soft.  Musculoskeletal: Normal range of motion.        General: No tenderness or deformity.  Lymphadenopathy:     Cervical: No cervical adenopathy.  Skin:    General: Skin is  warm and dry.     Findings: No erythema or rash.  Neurological:     Mental Status: He is alert and oriented to person, place, and time.  Psychiatric:        Behavior: Behavior normal.        Thought Content: Thought content normal.        Judgment: Judgment normal.     Lab Results  Component Value Date   WBC 3.2 (L) 10/10/2017   HGB 14.9 10/10/2017   HCT 42.7 10/10/2017   MCV 87.5 10/10/2017   PLT 195 10/10/2017     Chemistry      Component Value Date/Time   NA 140 10/10/2017 0747   NA 146 (H) 03/28/2017 0813   K 4.2 10/10/2017 0747   K 4.2 03/28/2017 0813   CL 106 10/10/2017 0747   CL 103 03/28/2017 0813   CO2 27 10/10/2017 0747   CO2 31 03/28/2017 0813   BUN 16 10/10/2017 0747   BUN 13 03/28/2017 0813   CREATININE 1.22 10/10/2017 0747   CREATININE 1.1 03/28/2017 0813      Component Value Date/Time    CALCIUM 9.4 10/10/2017 0747   CALCIUM 9.4 03/28/2017 0813   ALKPHOS 70 10/10/2017 0747   ALKPHOS 74 03/28/2017 0813   AST 22 10/10/2017 0747   ALT 25 10/10/2017 0747   ALT 39 03/28/2017 0813   BILITOT 0.8 10/10/2017 0747         Impression and Plan: 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  I am not sure how he developed this plantar vein thrombus.  He is not hypercoagulable as far as I can tell outside the fact that he has had a positive lupus anticoagulant.  He is on full dose aspirin.  I think this is reasonable.  We will only see him back as necessary.  I do think we had to have him come back on schedule.  He certainly knows when there is a problem to call us.  He does a great job with a lot of my patients.  He does injections a for 1 of the local orthopedic practices.   Eric Macho, MD 1/30/20208:38 AM

## 2018-05-16 ENCOUNTER — Encounter: Payer: Self-pay | Admitting: Hematology & Oncology

## 2018-08-15 IMAGING — US US EXTREM LOW VENOUS BILAT
1 series · 13 of 24 positions shown · non-contrast
Comparison: Chest CT angiogram April 10, 2016 and July 04, 2016

CLINICAL DATA: Report of previous right peroneal vein thrombus.
Prior pulmonary emboli

EXAM:
BILATERAL LOWER EXTREMITY VENOUS DUPLEX ULTRASOUND
TECHNIQUE: Gray-scale sonography with graded compression, as well as color
Doppler and duplex ultrasound were performed to evaluate the lower
extremity deep venous systems from the level of the common femoral
vein and including the common femoral, femoral, profunda femoral,
popliteal and calf veins including the posterior tibial, peroneal
and gastrocnemius veins when visible. The superficial great
saphenous vein was also interrogated. Spectral Doppler was utilized
to evaluate flow at rest and with distal augmentation maneuvers in
the common femoral, femoral and popliteal veins.

[Series 1: us extrem low venous bilat · 0.08mm/px · 47 acquisitions, 13 frames shown]
[im 1/47]
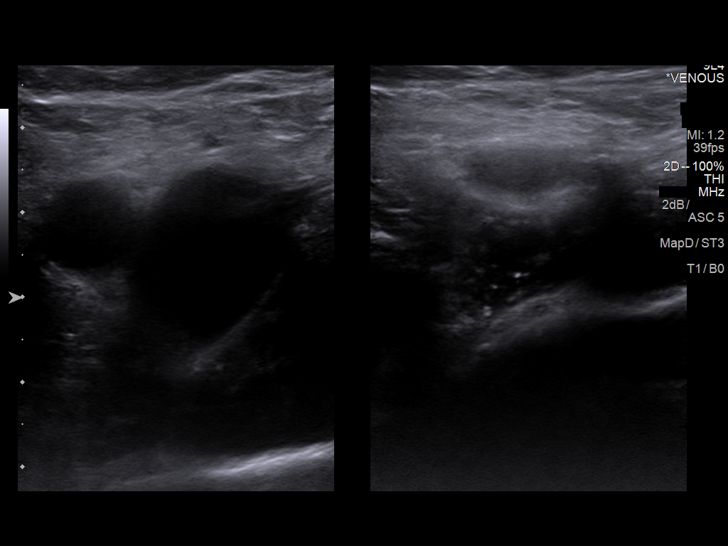
[im 5/47]
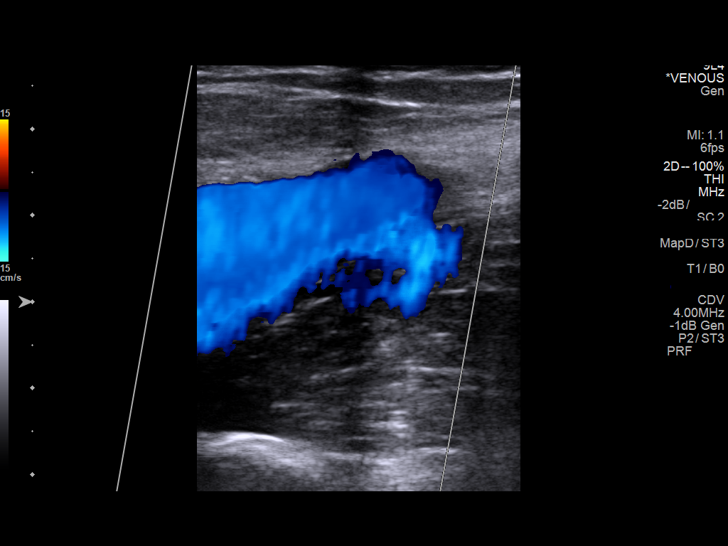
[im 9/47]
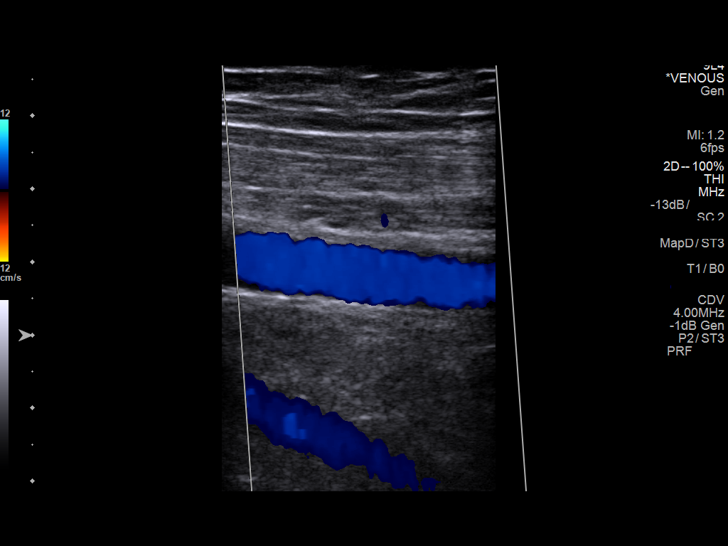
[im 13/47]
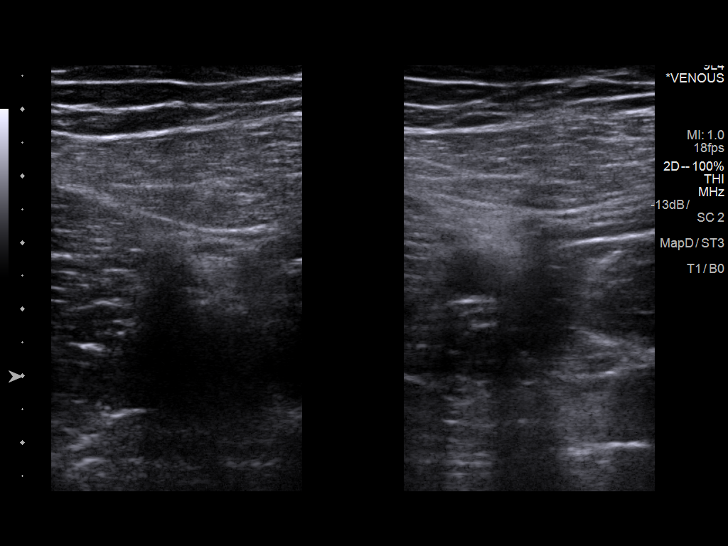
[im 17/47]
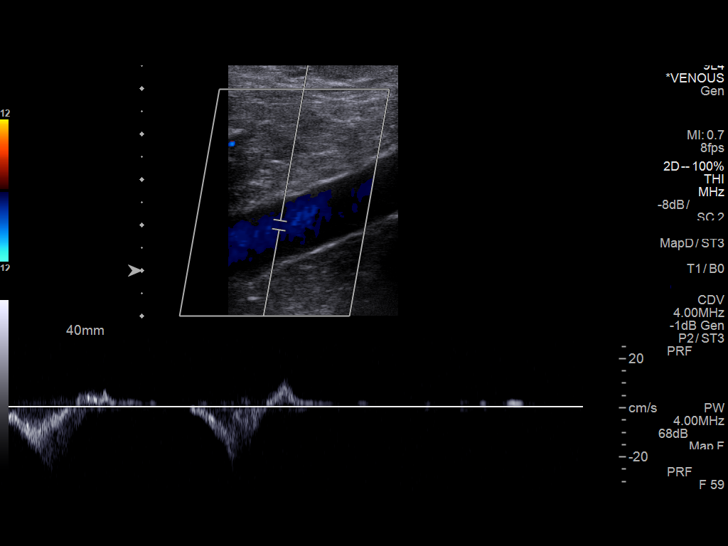
[im 21/47]
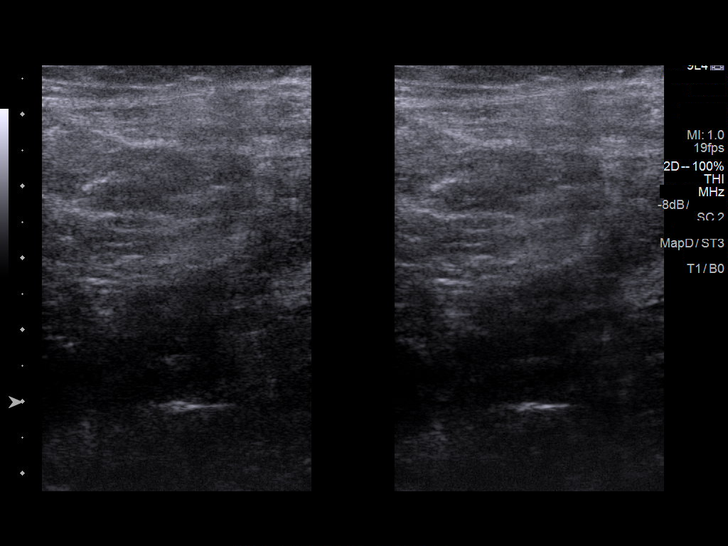
[im 25/47]
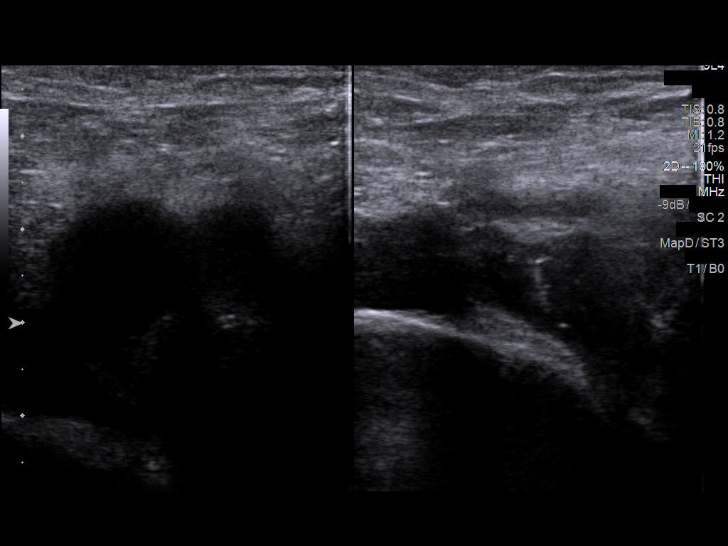
[im 27/47]
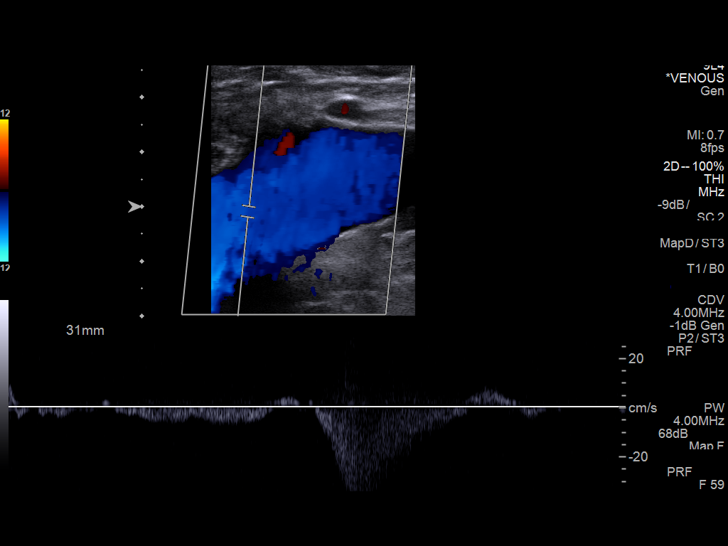
[im 31/47]
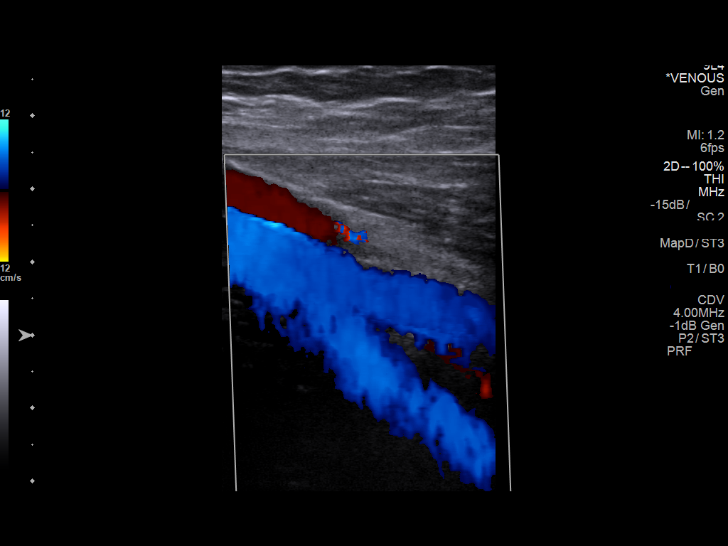
[im 35/47]
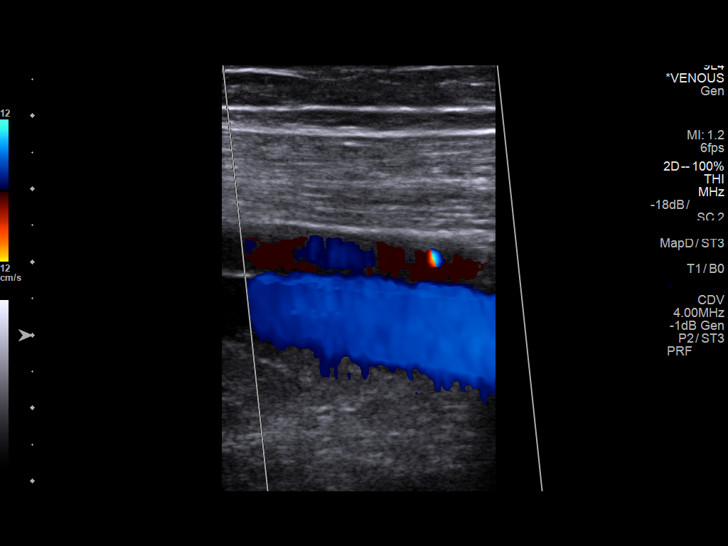
[im 39/47]
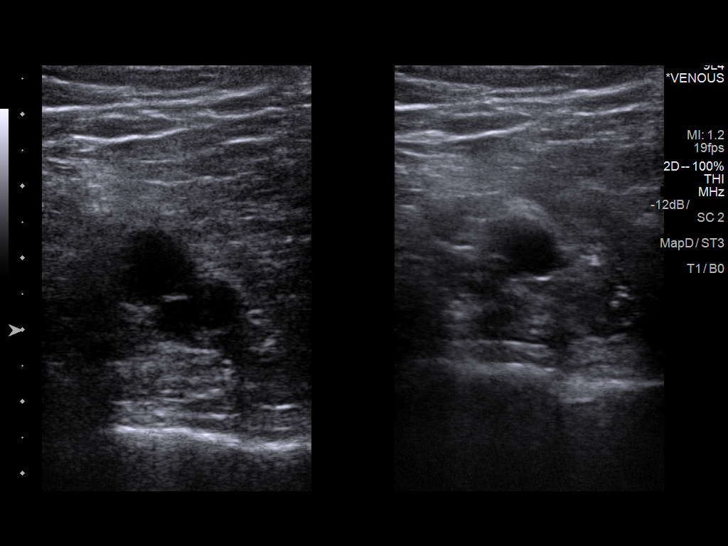
[im 43/47]
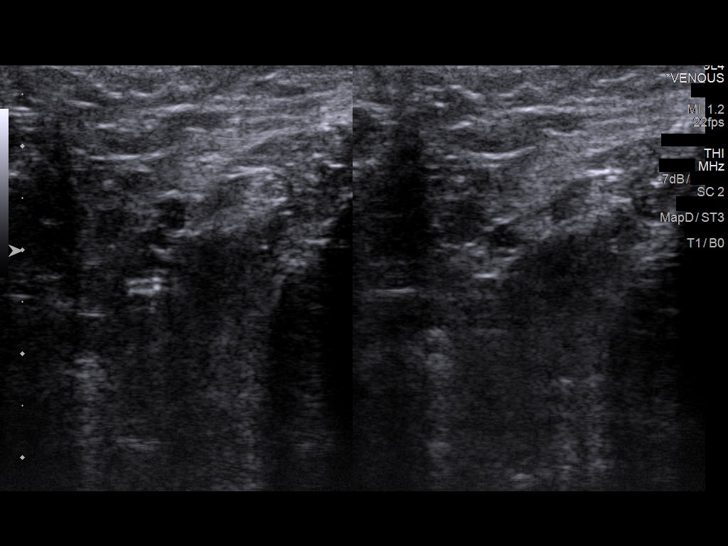
[im 47/47]
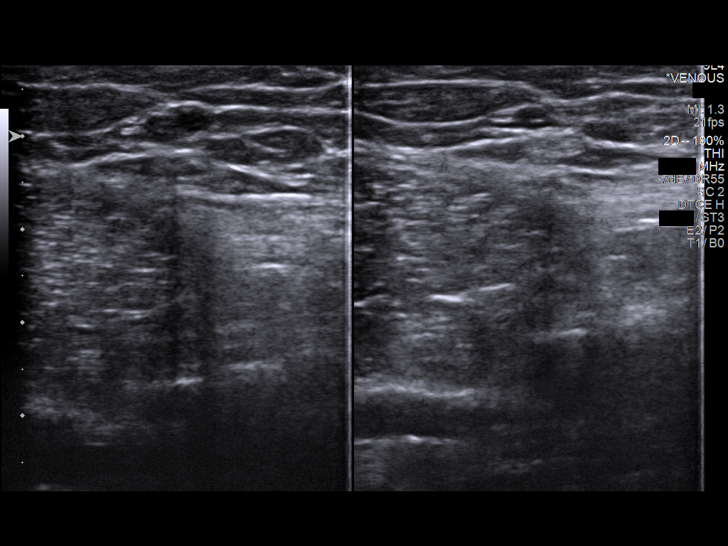

[13 of 24 positions shown; findings below may reference images not displayed]

FINDINGS: RIGHT LOWER EXTREMITY

Common Femoral Vein: No evidence of thrombus. Normal
compressibility, respiratory phasicity and response to augmentation.

Saphenofemoral Junction: No evidence of thrombus. Normal
compressibility and flow on color Doppler imaging.

Profunda Femoral Vein: No evidence of thrombus. Normal
compressibility and flow on color Doppler imaging.

Femoral Vein: No evidence of thrombus. Normal compressibility,
respiratory phasicity and response to augmentation.

Popliteal Vein: No evidence of thrombus. Normal compressibility,
respiratory phasicity and response to augmentation.

Calf Veins: No evidence of thrombus. Normal compressibility and flow
on color Doppler imaging.

Superficial Great Saphenous Vein: No evidence of thrombus. Normal
compressibility and flow on color Doppler imaging.

Venous Reflux:  None.

Other Findings:  None.

LEFT LOWER EXTREMITY

Common Femoral Vein: No evidence of thrombus. Normal
compressibility, respiratory phasicity and response to augmentation.

Saphenofemoral Junction: No evidence of thrombus. Normal
compressibility and flow on color Doppler imaging.

Profunda Femoral Vein: No evidence of thrombus. Normal
compressibility and flow on color Doppler imaging.

Femoral Vein: No evidence of thrombus. Normal compressibility,
respiratory phasicity and response to augmentation.

Popliteal Vein: No evidence of thrombus. Normal compressibility,
respiratory phasicity and response to augmentation.

Calf Veins: No evidence of thrombus. Normal compressibility and flow
on color Doppler imaging.

Superficial Great Saphenous Vein: No evidence of thrombus. Normal
compressibility and flow on color Doppler imaging.

Venous Reflux:  None.

Other Findings:  None.
IMPRESSION: No evidence of deep venous thrombosis in either lower extremity.

## 2019-02-12 ENCOUNTER — Other Ambulatory Visit: Payer: Self-pay

## 2019-02-12 DIAGNOSIS — Z20822 Contact with and (suspected) exposure to covid-19: Secondary | ICD-10-CM

## 2019-02-14 LAB — NOVEL CORONAVIRUS, NAA: SARS-CoV-2, NAA: NOT DETECTED

## 2019-11-13 ENCOUNTER — Other Ambulatory Visit: Payer: Self-pay

## 2020-05-10 ENCOUNTER — Encounter (INDEPENDENT_AMBULATORY_CARE_PROVIDER_SITE_OTHER): Payer: Self-pay | Admitting: Ophthalmology

## 2020-05-10 ENCOUNTER — Ambulatory Visit (INDEPENDENT_AMBULATORY_CARE_PROVIDER_SITE_OTHER): Payer: No Typology Code available for payment source | Admitting: Ophthalmology

## 2020-05-10 ENCOUNTER — Other Ambulatory Visit: Payer: Self-pay

## 2020-05-10 DIAGNOSIS — R0683 Snoring: Secondary | ICD-10-CM | POA: Diagnosis not present

## 2020-05-10 DIAGNOSIS — H35041 Retinal micro-aneurysms, unspecified, right eye: Secondary | ICD-10-CM

## 2020-05-10 DIAGNOSIS — I2699 Other pulmonary embolism without acute cor pulmonale: Secondary | ICD-10-CM

## 2020-05-10 DIAGNOSIS — H35071 Retinal telangiectasis, right eye: Secondary | ICD-10-CM

## 2020-05-10 DIAGNOSIS — D573 Sickle-cell trait: Secondary | ICD-10-CM

## 2020-05-10 DIAGNOSIS — I82401 Acute embolism and thrombosis of unspecified deep veins of right lower extremity: Secondary | ICD-10-CM | POA: Diagnosis not present

## 2020-05-10 MED ORDER — FLUORESCEIN SODIUM 10 % IV SOLN
500.0000 mg | INTRAVENOUS | Status: AC | PRN
Start: 2020-05-10 — End: 2020-05-10
  Administered 2020-05-10: 500 mg via INTRAVENOUS

## 2020-05-10 NOTE — Assessment & Plan Note (Signed)
I discussed with the patient this "unknown origin of this disorder" and yet I have also discussed with him my association of the last 1516 years and aggressive management and evaluation looking for sleep apnea in folks such as this.  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  Thus even "mild" sleep apnea may have some adverse hypoxic event to the macular function in each eye.  The presence of right ankle venules in each eye do suggest he has ongoing chronic hypoxia although of a mild extent since there is no active macular edema nor active microaneurysm or changes in the left eye.  Technically a small amount of macular edema is present superior to the fovea in the right eye adjacent to the large prominent microaneurysm  Thus I will suggest the patient have nighttime oximetry done via a continuous oximeter that he can purchase by himself all over the Internet usually [employer].  In this way he will determine whether or not he has periodic drops of his oxygen sats below 89%.  I explained however to the patient that this is a nonhemoglobin S carrying patients and thus he may have a more sensitive system  If nighttime hypoxia occurs I will be asking for formal sleep study testing to look for signs of treatable disease

## 2020-05-10 NOTE — Assessment & Plan Note (Signed)
Patient continues on Xarelto 5 mg p.o. be ID  Patient reports never having had a systemic thrombotic or embolic event

## 2020-05-10 NOTE — Progress Notes (Signed)
05/10/2020     CHIEF COMPLAINT Patient presents for Blurred Vision (Pt c/o decreased vision OU. Pt states he has had ongoing issues with OS. Pt states recently he has noticed a fuzzy spot in OD vision that comes and goes. Pt states he has more difficulty focusing on smaller details. Denies FOL and floaters.)   HISTORY OF PRESENT ILLNESS: Eric Gaines is a 45 y.o. male who presents to the clinic today for:   HPI    Blurred Vision    In right eye.  Onset was gradual.  Vision is difficult to focus and with a spot.  Severity is moderate.  Occurring constantly.  It is worse throughout the day and when reading.  Context:  near vision.  Treatments tried include no treatments.  Response to treatment was no improvement.  I, the attending physician,  performed the HPI with the patient and updated documentation appropriately. Additional comments: Pt c/o decreased vision OU. Pt states he has had ongoing issues with OS. Pt states recently he has noticed a fuzzy spot in OD vision that comes and goes. Pt states he has more difficulty focusing on smaller details. Denies FOL and floaters.       Last edited by Tilda Franco on 05/10/2020  3:44 PM. (History)      Referring physician: Chesley Noon, MD [place],   [id number]  HISTORICAL INFORMATION:   Selected notes from the MEDICAL RECORD NUMBER       CURRENT MEDICATIONS: No current outpatient medications on file. (Ophthalmic Drugs)   No current facility-administered medications for this visit. (Ophthalmic Drugs)   Current Outpatient Medications (Other)  Medication Sig  . aspirin 325 MG EC tablet Take 325 mg by mouth daily.  . folic acid (FOLVITE) 1 MG tablet Take 1 mg by mouth daily.  Marland Kitchen oxyCODONE-acetaminophen (PERCOCET) 10-325 MG tablet TK 1 T PO Q 8 H PRN P  . rivaroxaban (XARELTO) 10 MG TABS tablet Take 1 tablet (10 mg total) by mouth daily. (Patient taking differently: Take 10 mg by mouth daily. Taking PRN)    No current facility-administered medications for this visit. (Other)      REVIEW OF SYSTEMS:    ALLERGIES No Known Allergies  PAST MEDICAL HISTORY History reviewed. No pertinent past medical history. History reviewed. No pertinent surgical history.  FAMILY HISTORY History reviewed. No pertinent family history.  SOCIAL HISTORY Social History   Tobacco Use  . Smoking status: Never Smoker  . Smokeless tobacco: Never Used  Substance Use Topics  . Alcohol use: Not Currently  . Drug use: Never         OPHTHALMIC EXAM:  Base Eye Exam    Visual Acuity (ETDRS)      Right Left   Dist [place] 20/20 20/25       Tonometry (Tonopen, 3:49 PM)      Right Left   Pressure 17 15       Pupils      Pupils Dark Light Shape React APD   Right PERRL 3 2 Round Brisk None   Left PERRL 3 2 Round Brisk None       Visual Fields (Counting fingers)      Left Right    Full Full       Neuro/Psych    Oriented x3: Yes   Mood/Affect: Normal       Dilation    Both eyes: 1.0% Mydriacyl, 2.5% Phenylephrine @ 3:49 PM  Slit Lamp and Fundus Exam    External Exam      Right Left   External Normal Normal       Slit Lamp Exam      Right Left   Lids/Lashes Normal Normal   Conjunctiva/Sclera White and quiet White and quiet   Cornea Clear Clear   Anterior Chamber Deep and quiet Deep and quiet   Iris Round and reactive Round and reactive   Lens Trace Nuclear sclerosis Trace Nuclear sclerosis   Anterior Vitreous Normal Normal       Fundus Exam      Right Left   Disc Normal Normal   C/D Ratio 0.4 0.65   Macula Microaneurysm large superior edge of FAZ    Vessels Normal           IMAGING AND PROCEDURES  Imaging and Procedures for 05/10/20  OCT, Retina - OU - Both Eyes       Right Eye Quality was good. Scan locations included subfoveal. Central Foveal Thickness: 324. Progression has no prior data. Findings include abnormal foveal contour, cystoid macular edema.    Left Eye Quality was good. Scan locations included subfoveal. Central Foveal Thickness: 302. Progression has no prior data. Findings include normal observations.   Notes Prominent microaneurysm seen clinically on the superior aspect of the macula.  This corresponds in the right eye to a small area of intraretinal fluid at the mid levels of the retina perifoveal  OS, may have micro-CME perifoveal yet no pathologic findings by OCT alone         Color Fundus Photography Optos - OU - Both Eyes       Right Eye Progression has no prior data. Disc findings include normal observations, increased cup to disc ratio. Macula : microaneurysms.   Left Eye Progression has no prior data. Disc findings include increased cup to disc ratio, normal observations. Macula : normal observations.   Notes Prominent perifoveal macular retinal microaneurysm, no exudate superior portion of the foveal avascular zone OD superiorly.  Overtly no other macroscopic retinal vascular disease other than some right angle venules in the temporal aspect of the macula OU       Fluorescein Angiography Optos (Transit OD)       Injection:  500 mg Fluorescein Sodium 10 % injection   NDC: [phone]   Route: Intravenous, Site: Left ArmRight Eye   Progression has no prior data.   Left Eye   Progression has no prior data.   Notes Early views disclose normal vascular filling and no evidence of retinal arterial closures or occlusions in all quadrants.  There is a prominent microaneurysm on the large size superior aspect of the fovea in the right eye  Which has delayed filling which does suggest this has been there for some time and could be undergoing spontaneous fibrotic closure  No associated significant exudation or accumulation of leakage.  Which has delayed filling which does suggest this has been there for some time and could be undergoing spontaneous fibrotic closure.  Right angle venules are noted on the  temporal aspect of the macula  Distal temporal circulation there is significant capillary remodeling and large pruning of capillaries without leakage or vascular compromise or occlusions.  OS, macular the right ankle venules 2 in number, no active leakage in the macular region, no confirmatory Microaneurysms seen on funduscopic angiography nonetheless there are areas inferior at the 6:00 meridian that are suspicious for early microaneurysm or changes without leakage.  OS similarly has the left eye has a area in the temporal retina with prominent capillary remodeling without leakages                ASSESSMENT/PLAN:  Retinal microaneurysm of right eye Will need fundus fluorescein angiography to determine the extent in the dimensions prevalence of retinal microaneurysms noted on funduscopic examination of the macula right eye  Deep vein thrombosis (DVT) of right lower extremity (HCC) Patient continues on Xarelto 5 mg p.o. 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  Thus even "mild" sleep apnea may have some adverse hypoxic event to the macular function in each eye.  The presence of right ankle venules in each eye do suggest he has ongoing chronic hypoxia although of a mild extent since there is no active macular edema nor active microaneurysm or changes in the left eye.  Technically a small amount of macular edema is present superior to the fovea in the right eye adjacent to the large prominent microaneurysm  Thus I will suggest the patient have nighttime oximetry done  via a continuous oximeter that he can purchase by himself all over the Internet usually [place].  In this way he will determine whether or not he has periodic drops of his oxygen sats below 89%.  I explained however to the patient that this is a nonhemoglobin S carrying patients and thus he may have a more sensitive system  If nighttime hypoxia occurs I will be asking for formal sleep study testing to look for signs of treatable disease      ICD-10-CM   1. Retinal microaneurysm of right eye  H35.041 OCT, Retina - OU - Both Eyes    Color Fundus Photography Optos - OU - Both Eyes    Fluorescein Angiography Optos (Transit OD)    Fluorescein Sodium 10 % injection 500 mg  2. Snores  R06.83   3. Deep vein thrombosis (DVT) of right lower extremity, unspecified chronicity, unspecified vein (HCC)  I82.401   4. 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 Other pulmonary embolism without acute cor pulmonale, unspecified chronicity (HCC) Chronic I26.99   6. Sickle-cell trait ([place]) Chronic D57.3     1.  Much discussion with the patient that the findings of the macula right IN the left eye are not systemic embolic effects from his underlying thromboembolic tendencies.  I did explain that MAC-TEL can occur in many patients it with associated sleep apnea either diagnosed or not treated both.  Also explained the importance of nighttime oximetry as a screening measure should he wish to evaluate prior to undergoing formal sleep apnea testing. 2.  I explained also that macular telangiectasis has no other known therapy other than observation and that typically it is said to "not be a risk factor for vision loss".  I explained to the patient that has not been my experience in my office for 30 years  3.  Ophthalmic Meds Ordered this visit:  Meds ordered this encounter  Medications  . Fluorescein Sodium 10 % injection 500 mg  Return in about 4 weeks (around 06/07/2020) for DILATE OU,  OCT.  There are no Patient Instructions on file for this visit.   Explained the diagnoses, plan, and follow up with the patient and they expressed understanding.  Patient expressed understanding of the importance of proper follow up care.   Clent Demark Irelyn Perfecto M.D. Diseases & Surgery of the Retina and Vitreous [facility] 05/10/20     Abbreviations: M myopia (nearsighted); A astigmatism; H hyperopia (farsighted); P presbyopia; Mrx spectacle prescription;  CTL contact lenses; OD right eye; OS left eye; OU both eyes  XT exotropia; ET esotropia; PEK punctate epithelial keratitis; PEE punctate epithelial erosions; DES dry eye syndrome; MGD meibomian gland dysfunction; ATs artificial tears; PFAT's preservative free artificial tears; [place] nuclear sclerotic cataract; PSC posterior subcapsular cataract; ERM epi-retinal membrane; PVD posterior vitreous detachment; RD retinal detachment; DM diabetes mellitus; DR diabetic retinopathy; NPDR non-proliferative diabetic retinopathy; PDR proliferative diabetic retinopathy; CSME clinically significant macular edema; DME diabetic macular edema; dbh dot blot hemorrhages; CWS cotton wool spot; POAG primary open angle glaucoma; C/D cup-to-disc ratio; HVF humphrey visual field; GVF goldmann visual field; OCT optical coherence tomography; IOP intraocular pressure; BRVO Branch retinal vein occlusion; CRVO central retinal vein occlusion; CRAO central retinal artery occlusion; BRAO branch retinal artery occlusion; RT retinal tear; SB scleral buckle; PPV pars plana vitrectomy; VH Vitreous hemorrhage; PRP panretinal laser photocoagulation; IVK intravitreal kenalog; VMT vitreomacular traction; MH Macular hole;  NVD neovascularization of the disc; NVE neovascularization elsewhere; AREDS age related eye disease study; ARMD age related macular degeneration; POAG primary open angle glaucoma; EBMD epithelial/anterior basement membrane dystrophy; ACIOL anterior chamber  intraocular lens; IOL intraocular lens; PCIOL posterior chamber intraocular lens; Phaco/IOL phacoemulsification with intraocular lens placement; [place] photorefractive keratectomy; LASIK laser assisted in situ keratomileusis; HTN hypertension; DM diabetes mellitus; COPD chronic obstructive pulmonary disease

## 2020-05-10 NOTE — Assessment & Plan Note (Signed)
Will need fundus fluorescein angiography to determine the extent in the dimensions prevalence of retinal microaneurysms noted on funduscopic examination of the macula right eye

## 2020-05-26 ENCOUNTER — Other Ambulatory Visit: Payer: No Typology Code available for payment source

## 2020-05-26 DIAGNOSIS — Z20822 Contact with and (suspected) exposure to covid-19: Secondary | ICD-10-CM

## 2020-05-27 LAB — NOVEL CORONAVIRUS, NAA: SARS-CoV-2, NAA: NOT DETECTED

## 2020-05-27 LAB — SARS-COV-2, NAA 2 DAY TAT

## 2020-06-07 ENCOUNTER — Encounter (INDEPENDENT_AMBULATORY_CARE_PROVIDER_SITE_OTHER): Payer: No Typology Code available for payment source | Admitting: Ophthalmology

## 2020-06-13 ENCOUNTER — Encounter (INDEPENDENT_AMBULATORY_CARE_PROVIDER_SITE_OTHER): Payer: Self-pay | Admitting: Ophthalmology

## 2020-06-28 NOTE — Telephone Encounter (Signed)
This examination was started by staff, but could not be completed due to office functioning circumstances. 

## 2020-07-05 ENCOUNTER — Encounter (INDEPENDENT_AMBULATORY_CARE_PROVIDER_SITE_OTHER): Payer: No Typology Code available for payment source | Admitting: Ophthalmology

## 2022-04-05 ENCOUNTER — Other Ambulatory Visit: Payer: Self-pay

## 2022-04-05 ENCOUNTER — Encounter (HOSPITAL_BASED_OUTPATIENT_CLINIC_OR_DEPARTMENT_OTHER): Payer: Self-pay | Admitting: Orthopaedic Surgery

## 2022-04-05 NOTE — Anesthesia Preprocedure Evaluation (Addendum)
Anesthesia Evaluation  Patient identified by MRN, date of birth, ID band Patient awake    Reviewed: Allergy & Precautions, NPO status , Patient's Chart, lab work & pertinent test results  History of Anesthesia Complications Negative for: history of anesthetic complications  Airway Mallampati: I  TM Distance: >3 FB Neck ROM: Full    Dental no notable dental hx. (+) Dental Advisory Given   Pulmonary PE (2018, xarelto- LD:)   Pulmonary exam normal        Cardiovascular + DVT (2018)  Normal cardiovascular exam     Neuro/Psych negative neurological ROS  negative psych ROS   GI/Hepatic negative GI ROS, Neg liver ROS,,,  Endo/Other  negative endocrine ROS    Renal/GU negative Renal ROS  negative genitourinary   Musculoskeletal L achilles tendon rupture   Abdominal   Peds  Hematology  (+) Blood dyscrasia, Sickle cell trait Lupus a/c positive    Anesthesia Other Findings   Reproductive/Obstetrics negative OB ROS                             Anesthesia Physical Anesthesia Plan  ASA: 2  Anesthesia Plan: General and Regional   Post-op Pain Management: Tylenol PO (pre-op)*, Toradol IV (intra-op)* and Regional block*   Induction: Intravenous  PONV Risk Score and Plan: 2 and Ondansetron, Dexamethasone, Midazolam and Treatment may vary due to age or medical condition  Airway Management Planned: Oral ETT  Additional Equipment: None  Intra-op Plan:   Post-operative Plan: Extubation in OR  Informed Consent: I have reviewed the patients History and Physical, chart, labs and discussed the procedure including the risks, benefits and alternatives for the proposed anesthesia with the patient or authorized representative who has indicated his/her understanding and acceptance.     Dental advisory given  Plan Discussed with: CRNA, Anesthesiologist and Surgeon  Anesthesia Plan Comments:          Anesthesia Quick Evaluation

## 2022-04-05 NOTE — Progress Notes (Signed)
   04/05/22 1314  PAT Phone Screen  Is the patient taking a GLP-1 receptor agonist? No  Do You Have Diabetes? No  Do You Have Hypertension? No  Have You Ever Been to the ER for Asthma? No  Have You Taken Oral Steroids in the Past 3 Months? No  Do you Take Phenteramine or any Other Diet Drugs? No  Recent  Lab Work, EKG, CXR? No  Do you have a history of heart problems? No  Any Recent Hospitalizations? No  Height 6\' 2"  (1.88 m)  Weight 97 kg  Pat Appointment Scheduled No   Pt to reach out to Dr Melford Aase and ensure he is in agreement with Asprin and Xarelto hold. Last dose of Xarelto 04/05/21. Claiborne Billings at Dr Rich Fuchs office also requesting instructions and will fax.

## 2022-04-06 NOTE — H&P (Signed)
PREOPERATIVE H&P  Chief Complaint: LEFT ACHILLES TENDON RUPTURE  HPI: Eric Gaines is a 47 y.o. male who is scheduled for, Procedure(s): [place].   Patient has a past medical history significant for DVT, sickle cell trait.   Patient had an injury to his left ankle while playing basketball. He felt a pop and immediate pain.   Symptoms are rated as moderate to severe, and have been worsening.  This is significantly impairing activities of daily living.    Please see clinic note for further details on this patient's care.    He has elected for surgical management.   Past Medical History:  Diagnosis Date   DVT (deep vein thrombosis) in pregnancy    2018   History of pulmonary embolus (PE)    2018   Lupus anticoagulant positive    Sickle cell trait (HCC)    Past Surgical History:  Procedure Laterality Date   NO PAST SURGERIES     Social History   Socioeconomic History   Marital status: Married    Spouse name: Dorette   Number of children: Not on file   Years of education: Not on file   Highest education level: Not on file  Occupational History   Not on file  Tobacco Use   Smoking status: Never   Smokeless tobacco: Never  Vaping Use   Vaping Use: Never used  Substance and Sexual Activity   Alcohol use: Yes    Comment: occas   Drug use: Never   Sexual activity: Not on file  Other Topics Concern   Not on file  Social History Narrative   Not on file   Social Determinants of Health   Financial Resource Strain: Not on file  Food Insecurity: Not on file  Transportation Needs: Not on file  Physical Activity: Not on file  Stress: Not on file  Social Connections: Not on file   History reviewed. No pertinent family history. No Known Allergies Prior to Admission medications   Medication Sig Start Date End Date Taking? Authorizing Provider  aspirin 325 MG EC tablet Take 325 mg by mouth daily.   Yes [provider]  finasteride  (PROPECIA) 1 MG tablet Take 1 mg by mouth daily.   Yes [provider]  rivaroxaban (XARELTO) 10 MG TABS tablet Take 1 tablet (10 mg total) by mouth daily. Patient taking differently: Take 10 mg by mouth daily. Taking PRN 03/07/18  Yes Ennever, Rudell Cobb, MD  folic acid (FOLVITE) 1 MG tablet Take 1 mg by mouth daily.    [provider]  oxyCODONE-acetaminophen (PERCOCET) 10-325 MG tablet TK 1 T PO Q 8 H PRN P 07/18/17   [provider]    ROS: All other systems have been reviewed and were otherwise negative with the exception of those mentioned in the HPI and as above.  Physical Exam: General: Alert, no acute distress Cardiovascular: No pedal edema Respiratory: No cyanosis, no use of accessory musculature GI: No organomegaly, abdomen is soft and non-tender Skin: No lesions in the area of chief complaint Neurologic: Sensation intact distally Psychiatric: Patient is competent for consent with normal mood and affect Lymphatic: No axillary or cervical lymphadenopathy  MUSCULOSKELETAL:  Left ankle: swelling of left ankle. Deformity of the achilles tendon. Positive Thompson test. NVI  Imaging: MRI of the left ankle demonstrates a left achilles tendon tear  Assessment: LEFT ACHILLES TENDON RUPTURE  Plan: Plan for Procedure(s): ACHILLES TENDON REPAIR  The risks benefits and alternatives  were discussed with the patient including but not limited to the risks of nonoperative treatment, versus surgical intervention including infection, bleeding, nerve injury,  blood clots, cardiopulmonary complications, morbidity, mortality, among others, and they were willing to proceed.   The patient acknowledged the explanation, agreed to proceed with the plan and consent was signed.   Operative Plan: Left achilles tendon tear Discharge Medications: standard DVT Prophylaxis: resume xarelto Physical Therapy: outpatient PT Special Discharge needs: splint   Ethelda Chick,  PA-C  04/06/2022 1:15 PM

## 2022-04-09 ENCOUNTER — Other Ambulatory Visit: Payer: Self-pay

## 2022-04-09 ENCOUNTER — Ambulatory Visit (HOSPITAL_BASED_OUTPATIENT_CLINIC_OR_DEPARTMENT_OTHER): Payer: No Typology Code available for payment source | Admitting: Anesthesiology

## 2022-04-09 ENCOUNTER — Ambulatory Visit (HOSPITAL_BASED_OUTPATIENT_CLINIC_OR_DEPARTMENT_OTHER)
Admission: RE | Admit: 2022-04-09 | Discharge: 2022-04-09 | Disposition: A | Discharge status: Home / Self Care | Payer: No Typology Code available for payment source | Source: Ambulatory Visit | Attending: Orthopaedic Surgery | Admitting: Orthopaedic Surgery

## 2022-04-09 ENCOUNTER — Encounter (HOSPITAL_BASED_OUTPATIENT_CLINIC_OR_DEPARTMENT_OTHER): Payer: Self-pay | Admitting: Orthopaedic Surgery

## 2022-04-09 ENCOUNTER — Encounter (HOSPITAL_BASED_OUTPATIENT_CLINIC_OR_DEPARTMENT_OTHER)
Admission: RE | Disposition: A | Discharge status: Home / Self Care | Payer: Self-pay | Source: Ambulatory Visit | Attending: Orthopaedic Surgery

## 2022-04-09 DIAGNOSIS — S86012A Strain of left Achilles tendon, initial encounter: Secondary | ICD-10-CM

## 2022-04-09 DIAGNOSIS — Y9367 Activity, basketball: Secondary | ICD-10-CM | POA: Diagnosis not present

## 2022-04-09 DIAGNOSIS — Z86718 Personal history of other venous thrombosis and embolism: Secondary | ICD-10-CM | POA: Diagnosis not present

## 2022-04-09 DIAGNOSIS — D573 Sickle-cell trait: Secondary | ICD-10-CM | POA: Insufficient documentation

## 2022-04-09 HISTORY — DX: Personal history of pulmonary embolism: Z86.711

## 2022-04-09 HISTORY — DX: Sickle-cell trait: D57.3

## 2022-04-09 HISTORY — DX: Deep phlebothrombosis in pregnancy, unspecified trimester: O22.30

## 2022-04-09 HISTORY — PX: ACHILLES TENDON SURGERY: SHX542

## 2022-04-09 HISTORY — DX: Raised antibody titer: R76.0

## 2022-04-09 SURGERY — REPAIR, TENDON, ACHILLES
Anesthesia: Regional | Site: Leg Lower | Laterality: Left

## 2022-04-09 MED ORDER — MIDAZOLAM HCL 2 MG/2ML IJ SOLN
INTRAMUSCULAR | Status: AC
  Filled 2022-04-09: qty 2

## 2022-04-09 MED ORDER — OXYCODONE HCL 5 MG/5ML PO SOLN: 5.0000 mg | Freq: Once | ORAL | Status: DC | PRN

## 2022-04-09 MED ORDER — LIDOCAINE 2% (20 MG/ML) 5 ML SYRINGE
INTRAMUSCULAR | Status: AC
  Filled 2022-04-09: qty 5

## 2022-04-09 MED ORDER — CEFAZOLIN SODIUM-DEXTROSE 2-4 GM/100ML-% IV SOLN
INTRAVENOUS | Status: AC
  Filled 2022-04-09: qty 100

## 2022-04-09 MED ORDER — ACETAMINOPHEN 500 MG PO TABS: 1000.0000 mg | ORAL_TABLET | Freq: Once | ORAL | Status: DC

## 2022-04-09 MED ORDER — ACETAMINOPHEN 500 MG PO TABS
ORAL_TABLET | ORAL | Status: AC
  Filled 2022-04-09: qty 2

## 2022-04-09 MED ORDER — ROPIVACAINE HCL 5 MG/ML IJ SOLN
INTRAMUSCULAR | Status: DC | PRN
  Administered 2022-04-09: 30 mL via PERINEURAL

## 2022-04-09 MED ORDER — FENTANYL CITRATE (PF) 100 MCG/2ML IJ SOLN
100.0000 ug | Freq: Once | INTRAMUSCULAR | Status: AC
  Administered 2022-04-09: 100 ug via INTRAVENOUS

## 2022-04-09 MED ORDER — MIDAZOLAM HCL 2 MG/2ML IJ SOLN
2.0000 mg | Freq: Once | INTRAMUSCULAR | Status: AC
  Administered 2022-04-09: 2 mg via INTRAVENOUS

## 2022-04-09 MED ORDER — GABAPENTIN 300 MG PO CAPS
300.0000 mg | ORAL_CAPSULE | Freq: Once | ORAL | Status: AC
  Administered 2022-04-09: 300 mg via ORAL

## 2022-04-09 MED ORDER — PROPOFOL 10 MG/ML IV BOLUS
INTRAVENOUS | Status: AC
  Filled 2022-04-09: qty 20

## 2022-04-09 MED ORDER — CELECOXIB 200 MG PO CAPS
ORAL_CAPSULE | ORAL | Status: AC
  Filled 2022-04-09: qty 2

## 2022-04-09 MED ORDER — SUGAMMADEX SODIUM 500 MG/5ML IV SOLN
INTRAVENOUS | Status: AC
  Filled 2022-04-09: qty 5

## 2022-04-09 MED ORDER — AMISULPRIDE (ANTIEMETIC) 5 MG/2ML IV SOLN: 10.0000 mg | Freq: Once | INTRAVENOUS | Status: DC | PRN

## 2022-04-09 MED ORDER — ROCURONIUM BROMIDE 100 MG/10ML IV SOLN
INTRAVENOUS | Status: DC | PRN
  Administered 2022-04-09: 80 mg via INTRAVENOUS

## 2022-04-09 MED ORDER — FENTANYL CITRATE (PF) 100 MCG/2ML IJ SOLN
INTRAMUSCULAR | Status: DC | PRN
  Administered 2022-04-09: 100 ug via INTRAVENOUS

## 2022-04-09 MED ORDER — OXYCODONE HCL 5 MG PO TABS: ORAL_TABLET | ORAL | 0 refills | Status: AC

## 2022-04-09 MED ORDER — FENTANYL CITRATE (PF) 100 MCG/2ML IJ SOLN
INTRAMUSCULAR | Status: AC
  Filled 2022-04-09: qty 2

## 2022-04-09 MED ORDER — ROCURONIUM BROMIDE 10 MG/ML (PF) SYRINGE
PREFILLED_SYRINGE | INTRAVENOUS | Status: AC
  Filled 2022-04-09: qty 10

## 2022-04-09 MED ORDER — PROPOFOL 10 MG/ML IV BOLUS
INTRAVENOUS | Status: DC | PRN
  Administered 2022-04-09: 200 mg via INTRAVENOUS

## 2022-04-09 MED ORDER — HYDROMORPHONE HCL 1 MG/ML IJ SOLN: 0.2500 mg | INTRAMUSCULAR | Status: DC | PRN

## 2022-04-09 MED ORDER — GABAPENTIN 300 MG PO CAPS
ORAL_CAPSULE | ORAL | Status: AC
  Filled 2022-04-09: qty 1

## 2022-04-09 MED ORDER — KETOROLAC TROMETHAMINE 30 MG/ML IJ SOLN: 30.0000 mg | Freq: Once | INTRAMUSCULAR | Status: DC | PRN

## 2022-04-09 MED ORDER — SUGAMMADEX SODIUM 500 MG/5ML IV SOLN
INTRAVENOUS | Status: DC | PRN
  Administered 2022-04-09: 400 mg via INTRAVENOUS

## 2022-04-09 MED ORDER — DEXAMETHASONE SODIUM PHOSPHATE 4 MG/ML IJ SOLN
INTRAMUSCULAR | Status: DC | PRN
  Administered 2022-04-09: 5 mg via INTRAVENOUS

## 2022-04-09 MED ORDER — DEXAMETHASONE SODIUM PHOSPHATE 10 MG/ML IJ SOLN
INTRAMUSCULAR | Status: AC
  Filled 2022-04-09: qty 1

## 2022-04-09 MED ORDER — ONDANSETRON HCL 4 MG PO TABS: 4.0000 mg | ORAL_TABLET | Freq: Three times a day (TID) | ORAL | 0 refills | Status: AC | PRN

## 2022-04-09 MED ORDER — ACETAMINOPHEN 500 MG PO TABS: 1000.0000 mg | ORAL_TABLET | Freq: Three times a day (TID) | ORAL | 0 refills | Status: AC

## 2022-04-09 MED ORDER — PHENYLEPHRINE HCL (PRESSORS) 10 MG/ML IV SOLN
INTRAVENOUS | Status: DC | PRN
  Administered 2022-04-09: 160 ug via INTRAVENOUS
  Administered 2022-04-09 (×2): 80 ug via INTRAVENOUS

## 2022-04-09 MED ORDER — MEPERIDINE HCL 25 MG/ML IJ SOLN: 6.2500 mg | INTRAMUSCULAR | Status: DC | PRN

## 2022-04-09 MED ORDER — ACETAMINOPHEN 500 MG PO TABS
1000.0000 mg | ORAL_TABLET | Freq: Once | ORAL | Status: AC
  Administered 2022-04-09: 1000 mg via ORAL

## 2022-04-09 MED ORDER — OXYCODONE HCL 5 MG PO TABS: 5.0000 mg | ORAL_TABLET | Freq: Once | ORAL | Status: DC | PRN

## 2022-04-09 MED ORDER — ONDANSETRON HCL 4 MG/2ML IJ SOLN
INTRAMUSCULAR | Status: AC
  Filled 2022-04-09: qty 2

## 2022-04-09 MED ORDER — CELECOXIB 200 MG PO CAPS
400.0000 mg | ORAL_CAPSULE | Freq: Once | ORAL | Status: AC
  Administered 2022-04-09: 400 mg via ORAL

## 2022-04-09 MED ORDER — LIDOCAINE HCL (CARDIAC) PF 100 MG/5ML IV SOSY
PREFILLED_SYRINGE | INTRAVENOUS | Status: DC | PRN
  Administered 2022-04-09: 40 mg via INTRAVENOUS

## 2022-04-09 MED ORDER — ONDANSETRON HCL 4 MG/2ML IJ SOLN
INTRAMUSCULAR | Status: DC | PRN
  Administered 2022-04-09: 4 mg via INTRAVENOUS

## 2022-04-09 MED ORDER — LACTATED RINGERS IV SOLN: INTRAVENOUS | Status: DC

## 2022-04-09 MED ORDER — DEXAMETHASONE SODIUM PHOSPHATE 10 MG/ML IJ SOLN
INTRAMUSCULAR | Status: DC | PRN
  Administered 2022-04-09: 10 mg

## 2022-04-09 MED ORDER — GABAPENTIN 100 MG PO CAPS: 100.0000 mg | ORAL_CAPSULE | Freq: Three times a day (TID) | ORAL | 0 refills | Status: AC

## 2022-04-09 MED ORDER — PHENYLEPHRINE 80 MCG/ML (10ML) SYRINGE FOR IV PUSH (FOR BLOOD PRESSURE SUPPORT)
PREFILLED_SYRINGE | INTRAVENOUS | Status: AC
  Filled 2022-04-09: qty 10

## 2022-04-09 MED ORDER — 0.9 % SODIUM CHLORIDE (POUR BTL) OPTIME
TOPICAL | Status: DC | PRN
  Administered 2022-04-09: 1000 mL

## 2022-04-09 MED ORDER — CEFAZOLIN SODIUM-DEXTROSE 2-4 GM/100ML-% IV SOLN
2.0000 g | INTRAVENOUS | Status: AC
  Administered 2022-04-09: 2 g via INTRAVENOUS

## 2022-04-09 MED ORDER — METHOCARBAMOL 500 MG PO TABS: 500.0000 mg | ORAL_TABLET | Freq: Three times a day (TID) | ORAL | 0 refills | Status: AC | PRN

## 2022-04-09 MED ORDER — ONDANSETRON HCL 4 MG/2ML IJ SOLN: 4.0000 mg | Freq: Once | INTRAMUSCULAR | Status: DC | PRN

## 2022-04-09 MED ORDER — VANCOMYCIN HCL 1000 MG IV SOLR
INTRAVENOUS | Status: DC | PRN
  Administered 2022-04-09: 1000 mg via TOPICAL

## 2022-04-09 SURGICAL SUPPLY — 58 items
APL PRP STRL LF DISP 70% ISPRP (MISCELLANEOUS) ×1
BANDAGE ESMARK 6X9 LF (GAUZE/BANDAGES/DRESSINGS) ×1 IMPLANT
BLADE SURG 15 STRL LF DISP TIS (BLADE) ×2 IMPLANT
BLADE SURG 15 STRL SS (BLADE) ×2
BNDG CMPR 5X4 CHSV STRCH STRL (GAUZE/BANDAGES/DRESSINGS) ×1
BNDG CMPR 9X6 STRL LF SNTH (GAUZE/BANDAGES/DRESSINGS)
BNDG COHESIVE 4X5 TAN STRL LF (GAUZE/BANDAGES/DRESSINGS) ×1 IMPLANT
BNDG ELASTIC 4X5.8 VLCR STR LF (GAUZE/BANDAGES/DRESSINGS) ×1 IMPLANT
BNDG ELASTIC 6X5.8 VLCR STR LF (GAUZE/BANDAGES/DRESSINGS) ×1 IMPLANT
BNDG ESMARK 6X9 LF (GAUZE/BANDAGES/DRESSINGS)
CHLORAPREP W/TINT 26 (MISCELLANEOUS) ×1 IMPLANT
COVER BACK TABLE 60X90IN (DRAPES) ×1 IMPLANT
CUFF TOURN SGL QUICK 34 (TOURNIQUET CUFF)
CUFF TRNQT CYL 34X4.125X (TOURNIQUET CUFF) IMPLANT
DRAPE EXTREMITY T 121X128X90 (DISPOSABLE) ×1 IMPLANT
DRAPE IMP U-DRAPE 54X76 (DRAPES) ×1 IMPLANT
DRAPE U-SHAPE 47X51 STRL (DRAPES) ×1 IMPLANT
DRSG EMULSION OIL 3X3 NADH (GAUZE/BANDAGES/DRESSINGS) ×1 IMPLANT
ELECT REM PT RETURN 9FT ADLT (ELECTROSURGICAL) ×1
ELECTRODE REM PT RTRN 9FT ADLT (ELECTROSURGICAL) ×1 IMPLANT
GAUZE PAD ABD 8X10 STRL (GAUZE/BANDAGES/DRESSINGS) IMPLANT
GAUZE SPONGE 4X4 12PLY STRL (GAUZE/BANDAGES/DRESSINGS) ×1 IMPLANT
GLOVE BIO SURGEON STRL SZ 6.5 (GLOVE) ×1 IMPLANT
GLOVE BIOGEL PI IND STRL 6.5 (GLOVE) ×1 IMPLANT
GLOVE BIOGEL PI IND STRL 8 (GLOVE) ×2 IMPLANT
GLOVE ECLIPSE 8.0 STRL XLNG CF (GLOVE) ×2 IMPLANT
GOWN STRL REUS W/ TWL LRG LVL3 (GOWN DISPOSABLE) ×2 IMPLANT
GOWN STRL REUS W/TWL LRG LVL3 (GOWN DISPOSABLE) ×2
GOWN STRL REUS W/TWL XL LVL3 (GOWN DISPOSABLE) ×1 IMPLANT
NEEDLE HYPO 22GX1.5 SAFETY (NEEDLE) IMPLANT
NS IRRIG 1000ML POUR BTL (IV SOLUTION) ×1 IMPLANT
PACK BASIN DAY SURGERY FS (CUSTOM PROCEDURE TRAY) ×1 IMPLANT
PAD CAST 4YDX4 CTTN HI CHSV (CAST SUPPLIES) ×1 IMPLANT
PADDING CAST COTTON 4X4 STRL (CAST SUPPLIES) ×1
PADDING CAST COTTON 6X4 STRL (CAST SUPPLIES) ×1 IMPLANT
PENCIL SMOKE EVACUATOR (MISCELLANEOUS) ×1 IMPLANT
SLEEVE SCD COMPRESS KNEE MED (STOCKING) IMPLANT
SPIKE FLUID TRANSFER (MISCELLANEOUS) IMPLANT
SPLINT PLASTER CAST FAST 5X30 (CAST SUPPLIES) IMPLANT
SPONGE T-LAP 4X18 ~~LOC~~+RFID (SPONGE) ×1 IMPLANT
SUT ETHILON 2 0 FS 18 (SUTURE) IMPLANT
SUT FIBERWIRE #2 38 T-5 BLUE (SUTURE) ×1
SUT MON AB 4-0 PC3 18 (SUTURE) ×1 IMPLANT
SUT VIC AB 0 CT1 27 (SUTURE) ×1
SUT VIC AB 0 CT1 27XBRD ANBCTR (SUTURE) ×1 IMPLANT
SUT VIC AB 2-0 SH 27 (SUTURE)
SUT VIC AB 2-0 SH 27XBRD (SUTURE) IMPLANT
SUT VIC AB 3-0 SH 27 (SUTURE) ×1
SUT VIC AB 3-0 SH 27X BRD (SUTURE) IMPLANT
SUT VICRYL 0 SH 27 (SUTURE) IMPLANT
SUTURE FIBERWR #2 38 T-5 BLUE (SUTURE) ×1 IMPLANT
SUTURE TAPE 1.3 40 TPR END (SUTURE) IMPLANT
SUTURETAPE 1.3 40 TPR END (SUTURE) ×2
SYR BULB EAR ULCER 3OZ GRN STR (SYRINGE) ×1 IMPLANT
TOWEL GREEN STERILE FF (TOWEL DISPOSABLE) ×2 IMPLANT
TUBE CONNECTING 20X1/4 (TUBING) IMPLANT
TUBE SUCTION HIGH CAP CLEAR NV (SUCTIONS) IMPLANT
UNDERPAD 30X36 HEAVY ABSORB (UNDERPADS AND DIAPERS) ×1 IMPLANT

## 2022-04-09 NOTE — Discharge Instructions (Addendum)
Ophelia Charter MD, MPH [facility] [address] [phone] (tel)   [phone] (fax)   POST-OPERATIVE INSTRUCTIONS - LOWER EXTREMITY   WOUND CARE Please keep splint clean dry and intact until followup.  You may shower on Post-Op Day #3.  You must keep splint dry during this process and may find that a plastic bag taped around the leg or alternatively a towel based bath may be a better option.   If you get your splint wet or if it is damaged please contact our clinic.  EXERCISES Due to your splint being in place you will not be able to bear weight through your extremity.   DO NOT PUT ANY WEIGHT ON YOUR OPERATIVE LEG Please use crutches or a walker to avoid weight bearing.   REGIONAL ANESTHESIA (NERVE BLOCKS) The anesthesia team may have performed a nerve block for you this is a great tool used to minimize pain.   The block may start wearing off overnight (between 8-24 hours postop) When the block wears off, your pain may go from nearly zero to the pain you would have had postop without the block. This is an abrupt transition but nothing dangerous is happening.   This can be a challenging period but utilize your as needed pain medications to try and manage this period. We suggest you use the pain medication the first night prior to going to bed, to ease this transition.  You may take an extra dose of narcotic when this happens if needed   POST-OP MEDICATIONS- Multimodal approach to pain control In general your pain will be controlled with a combination of substances.  Prescriptions unless otherwise discussed are electronically sent to your pharmacy.  This is a carefully made plan we use to minimize narcotic use.      Acetaminophen - Non-narcotic pain medicine taken on a scheduled basis  Gabapentin - this is to help with nerve based pain, take on a scheduled basis Robaxin - take as needed for muscle spasms Oxycodone - This is a strong narcotic, to  be used only on an "as needed" basis for SEVERE pain. Zofran - take as needed for nausea   FOLLOW-UP If you develop a Fever (>101.5), Redness or Drainage from the surgical incision site, please call our office to arrange for an evaluation. Please call the office to schedule a follow-up appointment for your incision check if you do not already have one, 7-10 days post-operatively.  IF YOU HAVE ANY QUESTIONS, PLEASE FEEL FREE TO CALL OUR OFFICE.  HELPFUL INFORMATION  If you had a block, it will wear off between 8-24 hrs postop typically.  This is period when your pain may go from nearly zero to the pain you would have had postop without the block.  This is an abrupt transition but nothing dangerous is happening.  You may take an extra dose of narcotic when this happens.  You should wean off your narcotic medicines as soon as you are able.  Most patients will be off or using minimal narcotics before their first postop appointment.   We suggest you use the pain medication the first night prior to going to bed, in order to ease any pain when the anesthesia wears off. You should avoid taking pain medications on an empty stomach as it will make you nauseous.  Do not drink alcoholic beverages or take illicit drugs when taking pain medications.  In most states it is against the law to drive while you are  in a splint or sling.  And certainly against the law to drive while taking narcotics.  You may return to work/school in the next couple of days when you feel up to it.   Pain medication may make you constipated.  Below are a few solutions to try in this order: Decrease the amount of pain medication if you aren't having pain. Drink lots of decaffeinated fluids. Drink prune juice and/or each dried prunes  If the first 3 don't work start with additional solutions Take Colace - an over-the-counter stool softener Take Senokot - an over-the-counter laxative Take Miralax - a stronger over-the-counter  laxative  For more information including helpful videos and documents visit our website:   [email or web address]    No Tylenol before 2:45pm if needed. No ibuprofen before 4:45pm if needed.  Post Anesthesia Home Care Instructions  Activity: Get plenty of rest for the remainder of the day. A responsible individual must stay with you for 24 hours following the procedure.  For the next 24 hours, DO NOT: -Drive a car -Paediatric nurse -Drink alcoholic beverages -Take any medication unless instructed by your physician -Make any legal decisions or sign important papers.  Meals: Start with liquid foods such as gelatin or soup. Progress to regular foods as tolerated. Avoid greasy, spicy, heavy foods. If nausea and/or vomiting occur, drink only clear liquids until the nausea and/or vomiting subsides. Call your physician if vomiting continues.  Special Instructions/Symptoms: Your throat may feel dry or sore from the anesthesia or the breathing tube placed in your throat during surgery. If this causes discomfort, gargle with warm salt water. The discomfort should disappear within 24 hours.  Regional Anesthesia Blocks  1. Numbness or the inability to move the "blocked" extremity may last from 3-48 hours after placement. The length of time depends on the medication injected and your individual response to the medication. If the numbness is not going away after 48 hours, call your surgeon.  2. The extremity that is blocked will need to be protected until the numbness is gone and the  Strength has returned. Because you cannot feel it, you will need to take extra care to avoid injury. Because it may be weak, you may have difficulty moving it or using it. You may not know what position it is in without looking at it while the block is in effect.  3. For blocks in the legs and feet, returning to weight bearing and walking needs to be done carefully. You will need to  wait until the numbness is entirely gone and the strength has returned. You should be able to move your leg and foot normally before you try and bear weight or walk. You will need someone to be with you when you first try to ensure you do not fall and possibly risk injury.  4. Bruising and tenderness at the needle site are common side effects and will resolve in a few days.  5. Persistent numbness or new problems with movement should be communicated to the surgeon or the [facility] [phone] [place] [phone]).

## 2022-04-09 NOTE — Anesthesia Procedure Notes (Signed)
Procedure Name: Intubation Date/Time: 04/09/2022 9:54 AM  Performed by: Glory Buff, CRNAPre-anesthesia Checklist: Patient identified, Emergency Drugs available, Suction available and Patient being monitored Patient Re-evaluated:Patient Re-evaluated prior to induction Oxygen Delivery Method: Circle system utilized Preoxygenation: Pre-oxygenation with 100% oxygen Induction Type: IV induction Ventilation: Mask ventilation without difficulty Laryngoscope Size: Miller and 3 Grade View: Grade I Tube type: Oral Tube size: 7.5 mm Number of attempts: 1 Airway Equipment and Method: Stylet and Oral airway Placement Confirmation: ETT inserted through vocal cords under direct vision, positive ETCO2 and breath sounds checked- equal and bilateral Secured at: 23 cm Tube secured with: Tape Dental Injury: Teeth and Oropharynx as per pre-operative assessment

## 2022-04-09 NOTE — Transfer of Care (Signed)
Immediate Anesthesia Transfer of Care Note  Patient: Eric Gaines  Procedure(s) Performed: ACHILLES TENDON REPAIR (Left: Leg Lower)  Patient Location: PACU  Anesthesia Type:GA combined with regional for post-op pain  Level of Consciousness: drowsy and patient cooperative  Airway & Oxygen Therapy: Patient Spontanous Breathing and Patient connected to face mask oxygen  Post-op Assessment: Report given to RN and Post -op Vital signs reviewed and stable  Post vital signs: Reviewed and stable  Last Vitals:  Vitals Value Taken Time  BP 123/81 04/09/22 1110  Temp    Pulse 75 04/09/22 1112  Resp 9 04/09/22 1112  SpO2 97 % 04/09/22 1112  Vitals shown include unvalidated device data.  Last Pain:  Vitals:   04/09/22 0832  PainSc: 0-No pain      Patients Stated Pain Goal: 2 (16/10/96 0454)  Complications: No notable events documented.

## 2022-04-09 NOTE — Anesthesia Postprocedure Evaluation (Signed)
Anesthesia Post Note  Patient: Eric Gaines  Procedure(s) Performed: ACHILLES TENDON REPAIR (Left: Leg Lower)     Patient location during evaluation: PACU Anesthesia Type: Regional and General Level of consciousness: awake and alert, oriented and patient cooperative Pain management: pain level controlled Vital Signs Assessment: post-procedure vital signs reviewed and stable Respiratory status: spontaneous breathing, nonlabored ventilation and respiratory function stable Cardiovascular status: blood pressure returned to baseline and stable Postop Assessment: no apparent nausea or vomiting Anesthetic complications: no   No notable events documented.  Last Vitals:  Vitals:   04/09/22 1145 04/09/22 1220  BP: 106/75 112/80  Pulse: 60 (!) 57  Resp: 10 20  Temp:  36.5 C  SpO2: 97% 95%    Last Pain:  Vitals:   04/09/22 1220  TempSrc: Oral  PainSc: 0-No pain    LLE Motor Response: No movement due to regional block (04/09/22 1230) LLE Sensation: Numbness (04/09/22 1230)          Pervis Hocking

## 2022-04-09 NOTE — Progress Notes (Signed)
Assisted Dr. Doroteo Glassman with left, popliteal, ultrasound guided block. Side rails up, monitors on throughout procedure. See vital signs in flow sheet. Tolerated Procedure well.

## 2022-04-09 NOTE — Interval H&P Note (Signed)
All questions answered, patient wants to proceed with procedure. ? ?

## 2022-04-09 NOTE — Op Note (Signed)
Orthopaedic Surgery Operative Note (CSN: [id number])  Eric Gaines  1976/01/17 Date of Surgery: 04/09/2022   Diagnoses:  LEFT ACHILLES TENDON RUPTURE  Procedure: Left Achilles tendon repair   Operative Finding Successful completion of the planned procedure.  Tendon itself was quite tendinopathic and thickened however he had a good end and repair with oversewn with #1 Vicryl.  Post-operative plan: The patient will be nonweightbearing in a splint.  The patient will be discharged home.  DVT prophylaxis not indicated as patient already on full dose anticoagulant.   Pain control with PRN pain medication preferring oral medicines.  Follow up plan will be scheduled in approximately 7 days for incision check.  Post-Op Diagnosis: Same Surgeons:Primary: Hiram Gash, MD Assistants:Caroline McBane PA-C Location: [facility] OR ROOM 5 Anesthesia: General with regional anesthesia Antibiotics: Ancef 2 g with local vancomycin powder 1 g at the surgical site Tourniquet time:  Total Tourniquet Time Documented: Calf (Left) - 40 minutes Total: Calf (Left) - 40 minutes  Estimated Blood Loss: Minimal Complications: None Specimens: None Implants: * No implants in log *  Indications for Surgery:   Eric Gaines is a 47 y.o. male with Achilles rupture while playing basketball.  Benefits and risks of operative and nonoperative management were discussed prior to surgery with patient/guardian(s) and informed consent form was completed.  Specific risks including infection, need for additional surgery, wound breakdown, neurovascular injury, rerupture amongst others.   Procedure:   The patient was identified properly. Informed consent was obtained and the surgical site was marked. The patient was taken up to suite where general anesthesia was induced.  The patient was positioned prone on a regular bed.  The left ankle was prepped and draped in the usual sterile fashion.  Timeout was performed before the beginning  of the case.  Tourniquet was used for the above duration.  We made our 8 cm incision just medial to midline of the Achilles tendon at the site of the palpable defect.  We took care to go just through skin making full-thickness incision and then sharply through the peritenon during this incision somewhat more centrally so that the peritenon closure and skin closure did not overlap.  This point we encountered immediate hematoma at the rupture site as well as the ruptured ends of the tendon.  We cleared any hematoma and irrigated the incisional bed.  This point the proximal and distal aspects of the tendon were whipstitch in [place] fashion up and down with the ends of the suture coming out the distal aspect of the tendon.  We used suture tape.  We able to achieve good purchase with a proximally 6-8 interlocking stitches placed up and down each limb.  The fixation was tested prior to tying down our repair.  The tear was quite mid substance to proximal and there was a significant amount of tendinopathic tissue.  Removed unhealthy tissue and had good healthy ends for repair.  We elevated the ends of the tendon and performed an anterior peritenon released sharply to allow for more complete closure of the peritenon posteriorly and avoid a skin to tendon bridge.  This point the suture tape that were placed in [place] fashion were tied on each side achieving good apposition of the tendon ends itself.  We used interrupted #1 Vicryl to augment our repair and provide additional tubularization of the tendon itself.  Vancomycin powder was placed after an additional irrigation and the peritoneum was closed with running 0 Vicryl for the skin was closed in a  multilayer fashion with nonabsorbable sutures.    A sterile dressing was placed in addition to a splint in equinus. The patient was awoken from general anesthesia and taken to the PACU in stable condition without complication.    Noemi Chapel, PA-C, present  and scrubbed throughout the case, critical for completion in a timely fashion, and for retraction, instrumentation, closure.

## 2022-04-09 NOTE — Anesthesia Procedure Notes (Addendum)
Anesthesia Regional Block: Popliteal block   Pre-Anesthetic Checklist: , timeout performed,  Correct Patient, Correct Site, Correct Laterality,  Correct Procedure, Correct Position, site marked,  Risks and benefits discussed,  Surgical consent,  Pre-op evaluation,  At surgeon's request and post-op pain management  Laterality: Left  Prep: Maximum Sterile Barrier Precautions used, chloraprep       Needles:  Injection technique: Single-shot  Needle Type: Echogenic Stimulator Needle     Needle Length: 9cm  Needle Gauge: 22     Additional Needles:   Procedures:,,,, ultrasound used (permanent image in chart),,    Narrative:  Start time: 04/09/2022 9:35 AM End time: 04/09/2022 9:40 AM Injection made incrementally with aspirations every 5 mL.  Performed by: Personally  Anesthesiologist: Pervis Hocking, DO  Additional Notes: Monitors applied. No increased pain on injection. No increased resistance to injection. Injection made in 5cc increments. Good needle visualization. Patient tolerated procedure well.

## 2022-04-10 ENCOUNTER — Encounter (HOSPITAL_BASED_OUTPATIENT_CLINIC_OR_DEPARTMENT_OTHER): Payer: Self-pay | Admitting: Orthopaedic Surgery
